# Patient Record
Sex: Female | Born: 1970 | Race: White | Hispanic: No | Marital: Married | Smoking: Never smoker
Health system: Southern US, Community
[De-identification: ages and names within clinical notes are randomized; demographics above are authoritative.]

## PROBLEM LIST (undated history)

## (undated) DIAGNOSIS — J45909 Unspecified asthma, uncomplicated: Secondary | ICD-10-CM

## (undated) DIAGNOSIS — T7840XA Allergy, unspecified, initial encounter: Secondary | ICD-10-CM

## (undated) DIAGNOSIS — Z8041 Family history of malignant neoplasm of ovary: Secondary | ICD-10-CM

## (undated) DIAGNOSIS — M069 Rheumatoid arthritis, unspecified: Secondary | ICD-10-CM

## (undated) DIAGNOSIS — Z1371 Encounter for nonprocreative screening for genetic disease carrier status: Secondary | ICD-10-CM

## (undated) DIAGNOSIS — N83299 Other ovarian cyst, unspecified side: Secondary | ICD-10-CM

## (undated) DIAGNOSIS — Z9189 Other specified personal risk factors, not elsewhere classified: Secondary | ICD-10-CM

## (undated) DIAGNOSIS — Z803 Family history of malignant neoplasm of breast: Secondary | ICD-10-CM

## (undated) DIAGNOSIS — E785 Hyperlipidemia, unspecified: Secondary | ICD-10-CM

## (undated) DIAGNOSIS — R51 Headache: Secondary | ICD-10-CM

## (undated) DIAGNOSIS — R519 Headache, unspecified: Secondary | ICD-10-CM

## (undated) HISTORY — DX: Other specified personal risk factors, not elsewhere classified: Z91.89

## (undated) HISTORY — DX: Encounter for nonprocreative screening for genetic disease carrier status: Z13.71

## (undated) HISTORY — DX: Hyperlipidemia, unspecified: E78.5

## (undated) HISTORY — DX: Allergy, unspecified, initial encounter: T78.40XA

## (undated) HISTORY — DX: Family history of malignant neoplasm of ovary: Z80.41

## (undated) HISTORY — DX: Unspecified asthma, uncomplicated: J45.909

## (undated) HISTORY — PX: INGUINAL HERNIA REPAIR: SUR1180

## (undated) HISTORY — DX: Rheumatoid arthritis, unspecified: M06.9

## (undated) HISTORY — DX: Headache, unspecified: R51.9

## (undated) HISTORY — PX: APPENDECTOMY: SHX54

## (undated) HISTORY — DX: Headache: R51

## (undated) HISTORY — DX: Family history of malignant neoplasm of breast: Z80.3

## (undated) HISTORY — PX: BIOPSY BREAST: PRO8

## (undated) HISTORY — DX: Other ovarian cyst, unspecified side: N83.299

---

## 2010-04-29 HISTORY — PX: BREAST EXCISIONAL BIOPSY: SUR124

## 2010-04-29 HISTORY — PX: BREAST BIOPSY: SHX20

## 2014-04-29 DIAGNOSIS — Z9189 Other specified personal risk factors, not elsewhere classified: Secondary | ICD-10-CM

## 2014-04-29 DIAGNOSIS — Z1371 Encounter for nonprocreative screening for genetic disease carrier status: Secondary | ICD-10-CM

## 2014-04-29 HISTORY — DX: Encounter for nonprocreative screening for genetic disease carrier status: Z13.71

## 2014-04-29 HISTORY — DX: Other specified personal risk factors, not elsewhere classified: Z91.89

## 2015-05-12 DIAGNOSIS — J301 Allergic rhinitis due to pollen: Secondary | ICD-10-CM | POA: Diagnosis not present

## 2015-05-15 DIAGNOSIS — J301 Allergic rhinitis due to pollen: Secondary | ICD-10-CM | POA: Diagnosis not present

## 2015-05-19 DIAGNOSIS — N946 Dysmenorrhea, unspecified: Secondary | ICD-10-CM | POA: Diagnosis not present

## 2015-05-19 DIAGNOSIS — Z315 Encounter for genetic counseling: Secondary | ICD-10-CM | POA: Diagnosis not present

## 2015-05-19 DIAGNOSIS — Z803 Family history of malignant neoplasm of breast: Secondary | ICD-10-CM | POA: Diagnosis not present

## 2015-05-19 DIAGNOSIS — Z8041 Family history of malignant neoplasm of ovary: Secondary | ICD-10-CM | POA: Diagnosis not present

## 2015-06-16 DIAGNOSIS — N83201 Unspecified ovarian cyst, right side: Secondary | ICD-10-CM | POA: Diagnosis not present

## 2015-06-16 DIAGNOSIS — N946 Dysmenorrhea, unspecified: Secondary | ICD-10-CM | POA: Diagnosis not present

## 2015-06-16 DIAGNOSIS — E782 Mixed hyperlipidemia: Secondary | ICD-10-CM | POA: Diagnosis not present

## 2015-06-23 ENCOUNTER — Encounter: Payer: Self-pay | Admitting: Cardiovascular Disease

## 2015-06-23 ENCOUNTER — Encounter (INDEPENDENT_AMBULATORY_CARE_PROVIDER_SITE_OTHER): Payer: Self-pay

## 2015-06-23 ENCOUNTER — Ambulatory Visit (INDEPENDENT_AMBULATORY_CARE_PROVIDER_SITE_OTHER): Payer: 59 | Admitting: Cardiovascular Disease

## 2015-06-23 VITALS — BP 120/82 | HR 78 | Ht 62.0 in | Wt 147.0 lb

## 2015-06-23 DIAGNOSIS — R0602 Shortness of breath: Secondary | ICD-10-CM

## 2015-06-23 DIAGNOSIS — I498 Other specified cardiac arrhythmias: Secondary | ICD-10-CM | POA: Diagnosis not present

## 2015-06-23 DIAGNOSIS — I499 Cardiac arrhythmia, unspecified: Secondary | ICD-10-CM | POA: Diagnosis not present

## 2015-06-23 DIAGNOSIS — Z0000 Encounter for general adult medical examination without abnormal findings: Secondary | ICD-10-CM

## 2015-06-23 DIAGNOSIS — E785 Hyperlipidemia, unspecified: Secondary | ICD-10-CM | POA: Insufficient documentation

## 2015-06-23 MED ORDER — ROSUVASTATIN CALCIUM 5 MG PO TABS: 5.0000 mg | ORAL_TABLET | Freq: Every day | ORAL | Status: DC

## 2015-06-23 MED ORDER — EZETIMIBE 10 MG PO TABS: 10.0000 mg | ORAL_TABLET | Freq: Every day | ORAL | Status: DC

## 2015-06-23 NOTE — Progress Notes (Signed)
Patient ID: Jamie Stanley, female    DOB: [birth year], 45 y.o.   MRN: [id number]  HPI Comments: Ms. Marcel Paris is a pleasant 45 year old woman with history of hyperlipidemia, grandfather with coronary disease, previous history of asthma, who presents for evaluation of "flip-flopping"in her chest, palpitations.   She presents by self-referral for evaluation  She reports that over the past year she has noticed rare episodes of her heart flipping. It will happen for a split second, she has very short episode of lightheadedness lasting for several seconds, less than a minute, then symptoms resolve. She has these possibly once per month. These can present at rest, most recently while driving felt like she almost needed to pull over.  Denies any shortness of breath or chest pressure on exertion. Works long hours, sometimes on weekends, 3 children ages 44 up into the 29s.  She reports that she and her husband would like to start an exercise program, once to make sure everything is okay given her above symptoms. She does have some shortness of breath if she climbs a flight of stairs quickly. Lab work reviewed with her showing LDL 150, total cholesterol 243, HDL 69  EKG shows normal sinus rhythm with rate 77 bpm, no significant ST or T-wave changes  She reports she previously tried Lipitor and simvastatin, she developed arthralgias in her hands and stopped the medications   Allergies  Allergen Reactions  . Demerol [Meperidine]     Nausea    . Penicillins     Rash     No current outpatient prescriptions on file prior to visit.   No current facility-administered medications on file prior to visit.    Past Medical History  Diagnosis Date  . Hyperlipidemia     Past Surgical History  Procedure Laterality Date  . Appendectomy    . Inguinal hernia repair    . Biopsy breast      left     Social History  reports that she has never smoked. She does not have any smokeless tobacco history on  file. She reports that she does not drink alcohol or use illicit drugs.  Family History family history includes Heart disease (age of onset: 105) in her paternal grandfather; Hyperlipidemia in her father and mother; Hypertension in her father and mother.   Review of Systems  Constitutional: Negative.   HENT: Negative.   Eyes: Negative.   Respiratory: Negative.   Cardiovascular: Positive for palpitations.  Gastrointestinal: Negative.   Endocrine: Negative.   Musculoskeletal: Negative.   Skin: Negative.   Allergic/Immunologic: Negative.   Neurological: Negative.   Hematological: Negative.   Psychiatric/Behavioral: Negative.   All other systems reviewed and are negative.   BP 120/82 mmHg  Pulse 78  Ht 5\' 2"  (1.575 m)  Wt 147 lb (66.679 kg)  BMI 26.88 kg/m2  Physical Exam  Constitutional: She is oriented to person, place, and time. She appears well-developed and well-nourished.  HENT:  Head: Normocephalic.  Nose: Nose normal.  Mouth/Throat: Oropharynx is clear and moist.  Eyes: Conjunctivae are normal. Pupils are equal, round, and reactive to light.  Neck: Normal range of motion. Neck supple. No JVD present.  Cardiovascular: Normal rate, regular rhythm, normal heart sounds and intact distal pulses.  Exam reveals no gallop and no friction rub.   No murmur heard. Pulmonary/Chest: Effort normal and breath sounds normal. No respiratory distress. She has no wheezes. She has no rales. She exhibits no tenderness.  Abdominal: Soft. Bowel sounds are normal. She  exhibits no distension. There is no tenderness.  Musculoskeletal: Normal range of motion. She exhibits no edema or tenderness.  Lymphadenopathy:    She has no cervical adenopathy.  Neurological: She is alert and oriented to person, place, and time. Coordination normal.  Skin: Skin is warm and dry. No rash noted. No erythema.  Psychiatric: She has a normal mood and affect. Her behavior is normal. Judgment and thought content  normal.

## 2015-06-23 NOTE — Assessment & Plan Note (Signed)
Long discussion concerning various types of arrhythmia I suspect she may be having rare episodes of PVCs These are not frequent enough to warrant daily medication or even medications on an as-needed basis. We have suggested if she starts to have more episodes, we could order a 30 day monitor to identify her ectopy. Otherwise clinical exam essentially benign, normal EKG indicating no significant structural heart disease.  Recommended it would be okay to start a regular exercise program

## 2015-06-23 NOTE — Assessment & Plan Note (Signed)
Long discussion concerning screening tests such as carotid ultrasounds and CT coronary calcium scoring. She will call us if she would like any of these scheduled

## 2015-06-23 NOTE — Patient Instructions (Addendum)
You are doing well.  Please try crestor two days a week  When zetia goes generic, Start one a day   For husband: goal ldl <70, total chol <150  Call the office if the palpitations get worse, 30 day monitor could be ordered   Research CT coronary calcium score  $150 [place]  Also available is the carotid ultrasound , $50  Please call us if you have new issues that need to be addressed before your next appt.

## 2015-06-23 NOTE — Assessment & Plan Note (Addendum)
Elevated cholesterol, difficulty tolerating Lipitor and simvastatin in the past Recommended she try Crestor 5 mg 2 days per week, would also add zetia 10 mg daily when this goes generic in March/April  would be okay to stay on fenofibrate if she likes, though typically this is more beneficial for triglycerides  She does have a strong family history of coronary artery disease and we have recommended a aggressive course of action . Recommended a exercise program, weight loss

## 2015-07-31 DIAGNOSIS — J301 Allergic rhinitis due to pollen: Secondary | ICD-10-CM | POA: Diagnosis not present

## 2015-08-18 DIAGNOSIS — J301 Allergic rhinitis due to pollen: Secondary | ICD-10-CM | POA: Diagnosis not present

## 2015-08-21 DIAGNOSIS — J301 Allergic rhinitis due to pollen: Secondary | ICD-10-CM | POA: Diagnosis not present

## 2015-11-24 DIAGNOSIS — J301 Allergic rhinitis due to pollen: Secondary | ICD-10-CM | POA: Diagnosis not present

## 2015-11-30 DIAGNOSIS — J301 Allergic rhinitis due to pollen: Secondary | ICD-10-CM | POA: Diagnosis not present

## 2015-12-19 DIAGNOSIS — M5387 Other specified dorsopathies, lumbosacral region: Secondary | ICD-10-CM | POA: Diagnosis not present

## 2015-12-19 DIAGNOSIS — M531 Cervicobrachial syndrome: Secondary | ICD-10-CM | POA: Diagnosis not present

## 2015-12-19 DIAGNOSIS — M9903 Segmental and somatic dysfunction of lumbar region: Secondary | ICD-10-CM | POA: Diagnosis not present

## 2015-12-26 DIAGNOSIS — M531 Cervicobrachial syndrome: Secondary | ICD-10-CM | POA: Diagnosis not present

## 2015-12-26 DIAGNOSIS — M9903 Segmental and somatic dysfunction of lumbar region: Secondary | ICD-10-CM | POA: Diagnosis not present

## 2015-12-26 DIAGNOSIS — M5387 Other specified dorsopathies, lumbosacral region: Secondary | ICD-10-CM | POA: Diagnosis not present

## 2016-02-27 ENCOUNTER — Ambulatory Visit: Payer: Self-pay | Admitting: Physician Assistant

## 2016-02-27 ENCOUNTER — Encounter: Payer: Self-pay | Admitting: Physician Assistant

## 2016-02-27 VITALS — BP 130/80 | HR 80 | Temp 98.4°F

## 2016-02-27 DIAGNOSIS — M5416 Radiculopathy, lumbar region: Secondary | ICD-10-CM

## 2016-02-27 MED ORDER — PREDNISONE 10 MG (48) PO TBPK: ORAL_TABLET | Freq: Every day | ORAL | 0 refills | Status: DC

## 2016-02-27 MED ORDER — BACLOFEN 10 MG PO TABS: 10.0000 mg | ORAL_TABLET | Freq: Three times a day (TID) | ORAL | 0 refills | Status: DC

## 2016-02-27 NOTE — Progress Notes (Signed)
S:  C/o low back pain for 3 months, no known injury, pain is worse with movement, increased with bending over, + numbness, tingling, into both legs, but it will change daily; no changes in bowel/urinary habits,  Using otc meds without relief, mother has had multiple disc problems Remainder ros neg  O:  Vitals wnl, nad, lungs c t a, cv rrr, spine tender in lumbar area, muscles in lower back spasmed , decreased rom with bending forward,  Neg slr, pt walks without difficulty, able to walk on toes and heels;  no foot drop noted, n/v intact  A: acute back pain,   P: use wet heat followed by ice, stretches, return to clinic if not better in 3 t 5 days, return earlier if worsening, rx meds: sterapred ds 10mg  12 d dose pack, baclofen, will refer to [facility] neurology as that is where her mother goes, will order mri if not better in 1 week

## 2016-03-01 DIAGNOSIS — J301 Allergic rhinitis due to pollen: Secondary | ICD-10-CM | POA: Diagnosis not present

## 2016-03-16 DIAGNOSIS — H52223 Regular astigmatism, bilateral: Secondary | ICD-10-CM | POA: Diagnosis not present

## 2016-03-18 DIAGNOSIS — J301 Allergic rhinitis due to pollen: Secondary | ICD-10-CM | POA: Diagnosis not present

## 2016-04-09 DIAGNOSIS — Z01419 Encounter for gynecological examination (general) (routine) without abnormal findings: Secondary | ICD-10-CM | POA: Diagnosis not present

## 2016-04-09 DIAGNOSIS — Z0000 Encounter for general adult medical examination without abnormal findings: Secondary | ICD-10-CM | POA: Diagnosis not present

## 2016-04-09 DIAGNOSIS — E785 Hyperlipidemia, unspecified: Secondary | ICD-10-CM | POA: Diagnosis not present

## 2016-04-09 DIAGNOSIS — Z1321 Encounter for screening for nutritional disorder: Secondary | ICD-10-CM | POA: Diagnosis not present

## 2016-04-09 DIAGNOSIS — Z3041 Encounter for surveillance of contraceptive pills: Secondary | ICD-10-CM | POA: Diagnosis not present

## 2016-04-09 DIAGNOSIS — Z1329 Encounter for screening for other suspected endocrine disorder: Secondary | ICD-10-CM | POA: Diagnosis not present

## 2016-04-09 DIAGNOSIS — Z1239 Encounter for other screening for malignant neoplasm of breast: Secondary | ICD-10-CM | POA: Diagnosis not present

## 2016-04-10 ENCOUNTER — Other Ambulatory Visit: Payer: Self-pay | Admitting: Obstetrics and Gynecology

## 2016-04-10 DIAGNOSIS — Z1231 Encounter for screening mammogram for malignant neoplasm of breast: Secondary | ICD-10-CM

## 2017-01-21 ENCOUNTER — Encounter: Payer: Self-pay | Admitting: Nurse Practitioner

## 2017-01-21 ENCOUNTER — Ambulatory Visit (INDEPENDENT_AMBULATORY_CARE_PROVIDER_SITE_OTHER): Payer: BC Managed Care – PPO | Admitting: Nurse Practitioner

## 2017-01-21 VITALS — BP 122/72 | HR 77 | Temp 98.7°F | Ht 62.0 in | Wt 137.8 lb

## 2017-01-21 DIAGNOSIS — Z23 Encounter for immunization: Secondary | ICD-10-CM

## 2017-01-21 DIAGNOSIS — Z7689 Persons encountering health services in other specified circumstances: Secondary | ICD-10-CM

## 2017-01-21 DIAGNOSIS — L659 Nonscarring hair loss, unspecified: Secondary | ICD-10-CM

## 2017-01-21 DIAGNOSIS — R002 Palpitations: Secondary | ICD-10-CM | POA: Diagnosis not present

## 2017-01-21 DIAGNOSIS — E7800 Pure hypercholesterolemia, unspecified: Secondary | ICD-10-CM | POA: Diagnosis not present

## 2017-01-21 DIAGNOSIS — Z91018 Allergy to other foods: Secondary | ICD-10-CM | POA: Diagnosis not present

## 2017-01-21 MED ORDER — EPINEPHRINE 0.3 MG/0.3ML IJ SOAJ: 0.3000 mg | Freq: Once | INTRAMUSCULAR | 1 refills | Status: AC

## 2017-01-21 NOTE — Patient Instructions (Addendum)
Jamie Stanley, Thank you for coming in to clinic today.  1. Thyroid: - Labs again today will check for function and presence of possible Hashimoto's Thyroiditis.  2. Cholesterol: - Consider injectable medications Repatha or Praluent.  Injection Subcutaneously every 2 weeks.  You will be due for FASTING BLOOD WORK (no food or drink after midnight before, only water or coffee without cream/sugar on the morning of) - Please go ahead and schedule a "Lab Only" visit in the morning at the clinic for lab draw in the next 7 days between 8a -11:45a.  For Lab Results, once available within 2-3 days of blood draw, you can can log in to MyChart online to view your results and a brief explanation. Also, we can discuss results at next follow-up visit.  Please schedule a follow-up appointment with [name], AGNP. Return in about 6 months (around 07/21/2017) for cholesterol.  If you have any other questions or concerns, please feel free to call the clinic or send a message through [place]. You may also schedule an earlier appointment if necessary.  You will receive a survey after today's visit either digitally by e-mail or paper by [employer]. Your experiences and feedback matter to [place].  Please respond so we know how we are doing as we provide care for you.   [name], DNP, AGNP-BC Adult Gerontology Nurse Practitioner [facility], [facility]    Alirocumab (Praluent)  Evolocumab (Repatha) injection What is this medicine? EVOLOCUMAB (e voe LOK ue mab) is known as a PCSK9 inhibitor. It is used to lower the level of cholesterol in the blood. It may be used alone or in combination with other cholesterol-lowering drugs. This drug may also be used to reduce the risk of heart attack, stroke, and certain types of heart surgery in patients with heart disease. This medicine may be used for other purposes; ask your health care provider or pharmacist if you have questions. COMMON BRAND NAME(S):  REPATHAAlirocumab injection What is this medicine? ALIROCUMAB (al i ROC ue mab) is known as a PCSK9 inhibitor. It is used to lower the level of cholesterol in the blood. This medicine is only for patients whose cholesterol is not controlled by diet and statin therapy. This medicine may be used for other purposes; ask your health care provider or pharmacist if you have questions. COMMON BRAND NAME(S): Praluent What should I tell my health care provider before I take this medicine? They need to know if you have any of these conditions: -any unusual or allergic reaction to alirocumab, other medicines, foods, dyes, or preservatives -pregnant or trying to get pregnant -breast-feeding How should I use this medicine? This medicine is for injection under the skin. You will be taught how to prepare and give this medicine. Use exactly as directed. Take your medicine at regular intervals. Do not take your medicine more often than directed. It is important that you put your used needles and syringes in a special sharps container. Do not put them in a trash can. If you do not have a sharps container, call your pharmacist or healthcare provider to get one. Talk to your pediatrician regarding the use of this medicine in children. Special care may be needed. Overdosage: If you think you have taken too much of this medicine contact a poison control center or emergency room at once. NOTE: This medicine is only for you. Do not share this medicine with others. What if I miss a dose? If you are on an every 2 week  schedule and miss a dose, take it as soon as you can. If your next dose is to be taken in less than 7 days, then do not take the missed dose. Take the next dose at your regular time. Do not take double or extra doses. If you are on a monthly schedule and miss a dose, take it as soon as you can. If the dose given is within 7 days of the missed dose, continue with your regular monthly schedule. If the missed dose  is administered after 7 days of the original date, administer the dose and start a new monthly schedule based on this date. What may interact with this medicine? Interactions are not expected. This list may not describe all possible interactions. Give your health care provider a list of all the medicines, herbs, non-prescription drugs, or dietary supplements you use. Also tell them if you smoke, drink alcohol, or use illegal drugs. Some items may interact with your medicine. What should I watch for while using this medicine? You may need blood work done while you are taking this medicine. What side effects may I notice from receiving this medicine? Side effects that you should report to your doctor or health care professional as soon as possible: -allergic reactions like skin rash, itching or hives, swelling of the face, lips, or tongue -signs and symptoms of infection like fever or chills; cough; sore throat; pain or trouble passing urine -signs and symptoms of liver injury like dark yellow or brown urine; general ill feeling or flu-like symptoms; light-colored stools; loss of appetite; nausea; right upper belly pain; unusually weak or tired; yellowing of the eyes or skin Side effects that usually do not require medical attention (report to your doctor or health care professional if they continue or are bothersome): -diarrhea -muscle cramps -muscle pain -pain, redness, or irritation at site where injected This list may not describe all possible side effects. Call your doctor for medical advice about side effects. You may report side effects to FDA at 1-800-FDA-1088. Where should I keep my medicine? Keep out of the reach of children. You will be instructed on how to store this medicine. Throw away any unused medicine after the expiration date on the label. NOTE: This sheet is a summary. It may not cover all possible information. If you have questions about this medicine, talk to your doctor,  pharmacist, or health care provider.  2018 Elsevier/Gold Standard (2015-08-23 15:09:06)  What should I tell my health care provider before I take this medicine? They need to know if you have any of these conditions: -an unusual or allergic reaction to evolocumab, other medicines, foods, dyes, or preservatives -pregnant or trying to get pregnant -breast-feeding How should I use this medicine? This medicine is for injection under the skin. You will be taught how to prepare and give this medicine. Use exactly as directed. Take your medicine at regular intervals. Do not take your medicine more often than directed. It is important that you put your used needles and syringes in a special sharps container. Do not put them in a trash can. If you do not have a sharps container, call your pharmacist or health care provider to get one. Talk to your pediatrician regarding the use of this medicine in children. While this drug may be prescribed for children as young as 13 years for selected conditions, precautions do apply. Overdosage: If you think you have taken too much of this medicine contact a poison control center or emergency room  at once. NOTE: This medicine is only for you. Do not share this medicine with others. What if I miss a dose? If you miss a dose, take it as soon as you can if there are more than 7 days until the next scheduled dose, or skip the missed dose and take the next dose according to your original schedule. Do not take double or extra doses. What may interact with this medicine? Interactions are not expected. This list may not describe all possible interactions. Give your health care provider a list of all the medicines, herbs, non-prescription drugs, or dietary supplements you use. Also tell them if you smoke, drink alcohol, or use illegal drugs. Some items may interact with your medicine. What should I watch for while using this medicine? You may need blood work while you are  taking this medicine. What side effects may I notice from receiving this medicine? Side effects that you should report to your doctor or health care professional as soon as possible: -allergic reactions like skin rash, itching or hives, swelling of the face, lips, or tongue -signs and symptoms of infection like fever or chills; cough; sore throat; pain or trouble passing urine Side effects that usually do not require medical attention (report to your doctor or health care professional if they continue or are bothersome): -diarrhea -nausea -muscle pain -pain, redness, or irritation at site where injected This list may not describe all possible side effects. Call your doctor for medical advice about side effects. You may report side effects to FDA at 1-800-FDA-1088. Where should I keep my medicine? Keep out of the reach of children. You will be instructed on how to store this medicine. Throw away any unused medicine after the expiration date on the label. NOTE: This sheet is a summary. It may not cover all possible information. If you have questions about this medicine, talk to your doctor, pharmacist, or health care provider.  2018 Elsevier/Gold Standard (2016-04-01 13:21:53)

## 2017-01-21 NOTE — Progress Notes (Signed)
Subjective:    Patient ID: Jamie Stanley, female    DOB: 1970/08/17, 46 y.o.   MRN: [id number]  Loma [place] is a 46 y.o. female presenting on 01/21/2017 for [place] Provider Pt last seen by PCP many years ago, but has had regular care by Fraser Din at [facility] .  Obtain records.  Hypercholesterolemia - has trouble w/ joints and statins - Is now taking red yeast rice since December.  Eating better and has lost 15 lbs. - Has tried Zetia, Crestor, Lipitor. - Most recently has had NMR lipoprofile checked 03/2016 and reveals elevated LDL w/ small dense LDL particles and high particle LDL number.  Family history hypothyroidism in females (mother, Mat aunt, MGM):  Most recent TSH via [facility] 12/2017was normal at 2.270.  Pt has lab record with her today. Pt has concerns about her thyroid function.  Notes she has had hair loss, feeling cold, palpitations.   - She denies, fatigue, excess energy, weight changes, heart racing, heat intolerance, changes in skin/nails, and lower leg swelling.  - She does not have any compressive symptoms to include difficulty swallowing, globus sensation, or difficulty breathing when lying flat.  Past Medical History:  Diagnosis Date  . Allergy   . Frequent headaches   . Hyperlipidemia   . Physiological ovarian cysts    Past Surgical History:  Procedure Laterality Date  . APPENDECTOMY    . BIOPSY BREAST     left   . INGUINAL HERNIA REPAIR     Social History   Social History  . Marital status: Married    Spouse name: N/A  . Number of children: N/A  . Years of education: N/A   Occupational History  . Not on file.   Social History Main Topics  . Smoking status: Never Smoker  . Smokeless tobacco: Never Used  . Alcohol use No  . Drug use: No  . Sexual activity: Not on file   Other Topics Concern  . Not on file   Social History Narrative  . No narrative on file   Family History  Problem Relation Age of  Onset  . Hypertension Mother   . Hyperlipidemia Mother   . Breast cancer Mother   . AAA (abdominal aortic aneurysm) Mother   . Hypothyroidism Mother   . Hypertension Father   . Hyperlipidemia Father   . Heart disease Paternal Grandfather 48  . Ovarian cancer Maternal Grandmother   . Hypothyroidism Maternal Grandmother   . Hypothyroidism Sister   . Hypothyroidism Paternal Grandmother    Current Outpatient Prescriptions on File Prior to Visit  Medication Sig  . acetaminophen (TYLENOL) 325 MG tablet Take 650 mg by mouth every 6 (six) hours as needed.  Marland Kitchen ibuprofen (ADVIL,MOTRIN) 800 MG tablet Take 800 mg by mouth every 8 (eight) hours as needed.  . loratadine (CLARITIN) 10 MG tablet Take 10 mg by mouth daily.  Marland Kitchen ezetimibe (ZETIA) 10 MG tablet Take 1 tablet (10 mg total) by mouth daily. (Patient not taking: Reported on 01/21/2017)  . NON FORMULARY Allergy shots   No current facility-administered medications on file prior to visit.     Review of Systems  Constitutional: Negative.   HENT: Negative.   Eyes: Negative.   Respiratory: Negative.   Cardiovascular: Positive for palpitations.  Gastrointestinal: Negative.   Endocrine: Positive for cold intolerance.       Hair loss  Genitourinary: Negative.   Musculoskeletal: Negative.   Skin: Negative.  Allergic/Immunologic: Negative.   Neurological: Negative.   Hematological: Negative.   Psychiatric/Behavioral: Negative.    Per HPI unless specifically indicated above      Objective:    BP 122/72 (BP Location: Right Arm, Patient Position: Sitting, Cuff Size: Normal)   Pulse 77   Temp 98.7 F (37.1 C) (Oral)   Ht 5\' 2"  (1.575 m)   Wt 137 lb 12.8 oz (62.5 kg)   BMI 25.20 kg/m   Wt Readings from Last 3 Encounters:  01/21/17 137 lb 12.8 oz (62.5 kg)  06/23/15 147 lb (66.7 kg)    Physical Exam General - healthy, well-appearing, NAD HEENT - Normocephalic, atraumatic Neck - supple, non-tender, no LAD, no thyromegaly, no  carotid bruit Heart - RRR, no murmurs heard Lungs - Clear throughout all lobes, no wheezing, crackles, or rhonchi. Normal work of breathing. Abdomen - soft, NTND, no masses, no hepatosplenomegaly, active bowel sounds.  Mild LLQ tenderness. Extremeties - non-tender, no edema, cap refill < 2 seconds, peripheral pulses intact +2 bilaterally Skin - warm, dry Neuro - awake, alert, oriented x3, normal gait Psych - Normal mood and affect, normal behavior   No results found for this or any previous visit.    Assessment & Plan:   Problem List Items Addressed This Visit      Other   Hyperlipidemia    Pt has persistent hyperlipidemia.  Has had NMR lipoprofile collected in 03/2016 which indicates higher risk for ASCVD events.  Pt has had statin intolerance in past, intolerance to Zetia also.  No longer taking fibrate or any other lipid management medication.  Pt is taking red yeast rice as natural remedy.   Discussed lack of evidence available for ASCVD risk reduction.  Plan: 1. Reviewed options for anti PCSK-9 injectable medications.  Pt declines for now.  Provided information and highly recommended to patient to reduce ASCVD risk w/ strong family hx of CAD. 2. Reviewed low glycemic diet, physical activity. Recommend weight loss 5 lbs. 3. Recheck standard lipid panel off medications and on red yeast rice. 4. Follow up 3-6 months.      Relevant Orders   Lipid panel    Other Visit Diagnoses    Need for diphtheria-tetanus-pertussis (Tdap) vaccine    -  Primary Pt needs tetanus vaccine.  > 10 years since last vaccination.  Plan: 1. Reviewed tetanus disease and need for vaccination. 2. Administer vaccine today.   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  Needs refill of epipen since her current pen is expired.  Plan: 1. Epinephrine auto-injector 0.3 mg provided. 2. Follow up as needed    Intermittent palpitations       Hair loss     Pt w/ family history of hypothyroidism.  Pt has not been tested for autoimmune causes for hypothyroidism in past.  Has had TSH last in December 2017.  Plan: 1. Labs today to recheck TSH, Free T3, Free T4.   2. TPO ab today to screen for autoimmune thyroid disease. 3. Follow up 1x per year and after labs.   Relevant Orders   TSH   T3, free   T4, free   Thyroid peroxidase antibody   Encounter to establish care     Previous care received at [facility].  Records will be requested.  Past medical, family, and surgical history reviewed w/ pt.  Meds ordered this encounter  Medications  . MICROGESTIN FE 1/20 1-20 MG-MCG tablet  . Red Yeast Rice Extract 300 MG CAPS    Sig: Take by mouth.  . DISCONTD: EPINEPHrine (EPIPEN 2-PAK) 0.3 mg/0.3 mL IJ SOAJ injection    Sig: Inject into the muscle once.  Marland Kitchen EPINEPHrine (EPIPEN 2-PAK) 0.3 mg/0.3 mL IJ SOAJ injection    Sig: Inject 0.3 mLs (0.3 mg total) into the muscle once.    Dispense:  2 Device    Refill:  1     Follow up plan: Return in about 6 months (around 07/21/2017) for cholesterol.  [name], DNP, AGPCNP-BC Adult Gerontology Primary Care Nurse Practitioner [facility] 01/24/2017, 1:14 PM

## 2017-01-24 ENCOUNTER — Encounter: Payer: Self-pay | Admitting: Nurse Practitioner

## 2017-01-24 DIAGNOSIS — Z91018 Allergy to other foods: Secondary | ICD-10-CM | POA: Insufficient documentation

## 2017-01-24 NOTE — Assessment & Plan Note (Addendum)
Pt has persistent hyperlipidemia.  Has had NMR lipoprofile collected in 03/2016 which indicates higher risk for ASCVD events.  Pt has had statin intolerance in past, intolerance to Zetia also.  No longer taking fibrate or any other lipid management medication.  Plan: 1. Reviewed options for anti PCSK-9 injectable medications.  Pt declines for now.  Provided information and highly recommended to patient to reduce ASCVD risk w/ strong family hx of CAD. 2. Reviewed low glycemic diet, physical activity. Recommend weight loss 5 lbs. 3. Follow up 3-6 months.

## 2017-01-28 ENCOUNTER — Other Ambulatory Visit: Payer: BC Managed Care – PPO

## 2017-01-29 LAB — LIPID PANEL
Cholesterol: 193 mg/dL (ref <200)
HDL: 52 mg/dL (ref >50)
LDL Cholesterol (Calc): 122 mg/dL (calc) — ABNORMAL HIGH
Non-HDL Cholesterol (Calc): 141 mg/dL (calc) — ABNORMAL HIGH (ref <130)
Total CHOL/HDL Ratio: 3.7 (calc) (ref <5.0)
Triglycerides: 89 mg/dL (ref <150)

## 2017-01-29 LAB — TSH: TSH: 1.4 mIU/L

## 2017-01-29 LAB — T4, FREE: Free T4: 1.1 ng/dL (ref 0.8–1.8)

## 2017-01-29 LAB — T3, FREE: T3, Free: 3.1 pg/mL (ref 2.3–4.2)

## 2017-01-29 LAB — THYROID PEROXIDASE ANTIBODY: Thyroperoxidase Ab SerPl-aCnc: 1 IU/mL (ref <9)

## 2017-01-30 ENCOUNTER — Encounter: Payer: Self-pay | Admitting: Nurse Practitioner

## 2017-02-21 ENCOUNTER — Ambulatory Visit (INDEPENDENT_AMBULATORY_CARE_PROVIDER_SITE_OTHER): Payer: BC Managed Care – PPO

## 2017-02-21 DIAGNOSIS — Z23 Encounter for immunization: Secondary | ICD-10-CM

## 2017-03-11 ENCOUNTER — Encounter: Payer: Self-pay | Admitting: Obstetrics and Gynecology

## 2017-03-11 ENCOUNTER — Other Ambulatory Visit: Payer: Self-pay

## 2017-03-11 MED ORDER — MICROGESTIN FE 1/20 1-20 MG-MCG PO TABS: 1.0000 | ORAL_TABLET | Freq: Every day | ORAL | 0 refills | Status: DC

## 2017-04-04 ENCOUNTER — Other Ambulatory Visit: Payer: Self-pay

## 2017-04-04 ENCOUNTER — Telehealth: Payer: Self-pay

## 2017-04-04 MED ORDER — MICROGESTIN FE 1/20 1-20 MG-MCG PO TABS: 1.0000 | ORAL_TABLET | Freq: Every day | ORAL | 1 refills | Status: DC

## 2017-04-04 NOTE — Telephone Encounter (Signed)
Pt had to r/s her AE. Reqeusting refills to last til 05/14/17 AE. Pharmacy [facility]. Cb#[phone]

## 2017-04-08 ENCOUNTER — Ambulatory Visit: Payer: Self-pay | Admitting: Obstetrics and Gynecology

## 2017-05-14 ENCOUNTER — Ambulatory Visit (INDEPENDENT_AMBULATORY_CARE_PROVIDER_SITE_OTHER): Payer: BC Managed Care – PPO | Admitting: Obstetrics and Gynecology

## 2017-05-14 ENCOUNTER — Encounter: Payer: Self-pay | Admitting: Obstetrics and Gynecology

## 2017-05-14 VITALS — BP 110/66 | HR 64 | Ht 62.0 in | Wt 140.0 lb

## 2017-05-14 DIAGNOSIS — Z124 Encounter for screening for malignant neoplasm of cervix: Secondary | ICD-10-CM

## 2017-05-14 DIAGNOSIS — Z01419 Encounter for gynecological examination (general) (routine) without abnormal findings: Secondary | ICD-10-CM

## 2017-05-14 DIAGNOSIS — Z3041 Encounter for surveillance of contraceptive pills: Secondary | ICD-10-CM

## 2017-05-14 DIAGNOSIS — Z1151 Encounter for screening for human papillomavirus (HPV): Secondary | ICD-10-CM | POA: Diagnosis not present

## 2017-05-14 DIAGNOSIS — Z1239 Encounter for other screening for malignant neoplasm of breast: Secondary | ICD-10-CM

## 2017-05-14 DIAGNOSIS — Z9189 Other specified personal risk factors, not elsewhere classified: Secondary | ICD-10-CM | POA: Insufficient documentation

## 2017-05-14 DIAGNOSIS — Z803 Family history of malignant neoplasm of breast: Secondary | ICD-10-CM

## 2017-05-14 DIAGNOSIS — Z1231 Encounter for screening mammogram for malignant neoplasm of breast: Secondary | ICD-10-CM | POA: Diagnosis not present

## 2017-05-14 MED ORDER — MICROGESTIN FE 1/20 1-20 MG-MCG PO TABS: 1.0000 | ORAL_TABLET | Freq: Every day | ORAL | 3 refills | Status: DC

## 2017-05-14 NOTE — Progress Notes (Signed)
PCP:  Mikey College, NP   Chief Complaint  Patient presents with  . Gynecologic Exam     HPI:      [name] is a 47 y.o. [phone] who LMP was Patient's last menstrual period was 05/10/2017 (exact date)., presents today for her annual examination.  Her menses are absent to 1 day light spotting with OCPs. Hx of menorrhagia without OCPs.  Dysmenorrhea none. She does not have intermenstrual bleeding.  Sex activity: single partner, contraception - OCP (estrogen/progesterone).  Last Pap: March 29, 2014  Results were: no abnormalities /neg HPV DNA  Hx of STDs: none  Last mammogram: April 07, 2015  Results were: normal--routine follow-up in 12 months There is a FH of breast cancer in her mom, mat grt aunt and pat grt aunt.  There is a FH of ovarian cancer in her MGM. Pt is My Risk neg 2016, her mom is BRCA neg 2015. IBIS=33%. The patient does do self-breast exams. Is taking Vit D supp. Has not had breast MRI.  Tobacco use: The patient denies current or previous tobacco use. Alcohol use: none No drug use.  Exercise: not active  She does get adequate calcium and Vitamin D in her diet.  Labs/hyperlipidemia with PCP now.   Past Medical History:  Diagnosis Date  . Allergy   . Asthma   . BRCA negative 2016   MyRisk neg  . Family history of breast cancer   . Family history of ovarian cancer   . Frequent headaches   . Hyperlipidemia   . Increased risk of breast cancer 2016   IBIS=33%  . Physiological ovarian cysts     Past Surgical History:  Procedure Laterality Date  . APPENDECTOMY    . BIOPSY BREAST     left   . INGUINAL HERNIA REPAIR      Family History  Problem Relation Age of Onset  . Hypertension Mother   . Hyperlipidemia Mother   . Breast cancer Mother 49       ER/PR type BRCA Neg  . AAA (abdominal aortic aneurysm) Mother   . Hypothyroidism Mother   . Hypertension Father   . Hyperlipidemia Father   . Glaucoma Father   . Heart disease Paternal  Grandfather 61  . Diabetes Paternal Grandfather   . Ovarian cancer Maternal Grandmother 2  . Hypothyroidism Maternal Grandmother   . Hypothyroidism Sister   . Hypothyroidism Paternal Grandmother   . Diabetes Paternal Grandmother   . Breast cancer Other 68  . Breast cancer Other 88    Social History   Socioeconomic History  . Marital status: Married    Spouse name: Not on file  . Number of children: Not on file  . Years of education: Not on file  . Highest education level: Not on file  Social Needs  . Financial resource strain: Not on file  . Food insecurity - worry: Not on file  . Food insecurity - inability: Not on file  . Transportation needs - medical: Not on file  . Transportation needs - non-medical: Not on file  Occupational History  . Not on file  Tobacco Use  . Smoking status: Never Smoker  . Smokeless tobacco: Never Used  Substance and Sexual Activity  . Alcohol use: No  . Drug use: No  . Sexual activity: Yes    Birth control/protection: Pill  Other Topics Concern  . Not on file  Social History Narrative  . Not on file  No outpatient medications have been marked as taking for the 05/14/17 encounter (Office Visit) with Roseanna Koplin, Deirdre Evener, PA-C.     ROS:  Review of Systems  Constitutional: Negative for fatigue, fever and unexpected weight change.  Respiratory: Negative for cough, shortness of breath and wheezing.   Cardiovascular: Negative for chest pain, palpitations and leg swelling.  Gastrointestinal: Negative for blood in stool, constipation, diarrhea, nausea and vomiting.  Endocrine: Negative for cold intolerance, heat intolerance and polyuria.  Genitourinary: Negative for dyspareunia, dysuria, flank pain, frequency, genital sores, hematuria, menstrual problem, pelvic pain, urgency, vaginal bleeding, vaginal discharge and vaginal pain.  Musculoskeletal: Negative for back pain, joint swelling and myalgias.  Skin: Negative for rash.  Neurological:  Negative for dizziness, syncope, light-headedness, numbness and headaches.  Hematological: Negative for adenopathy.  Psychiatric/Behavioral: Negative for agitation, confusion, sleep disturbance and suicidal ideas. The patient is not nervous/anxious.      Objective: BP 110/66 (BP Location: Left Arm, Patient Position: Sitting, Cuff Size: Normal)   Pulse 64   Ht 5' 2"  (1.575 m)   Wt 140 lb (63.5 kg)   LMP 05/10/2017 (Exact Date)   BMI 25.61 kg/m    Physical Exam  Constitutional: She is oriented to person, place, and time. She appears well-developed and well-nourished.  Genitourinary: Vagina normal and uterus normal. There is no rash or tenderness on the right labia. There is no rash or tenderness on the left labia. No erythema or tenderness in the vagina. No vaginal discharge found. Right adnexum does not display mass and does not display tenderness. Left adnexum does not display mass and does not display tenderness. Cervix does not exhibit motion tenderness or polyp. Uterus is not enlarged or tender.  Neck: Normal range of motion. No thyromegaly present.  Cardiovascular: Normal rate, regular rhythm and normal heart sounds.  No murmur heard. Pulmonary/Chest: Effort normal and breath sounds normal. Right breast exhibits no mass, no nipple discharge, no skin change and no tenderness. Left breast exhibits no mass, no nipple discharge, no skin change and no tenderness.  Abdominal: Soft. There is no tenderness. There is no guarding.  Musculoskeletal: Normal range of motion.  Neurological: She is alert and oriented to person, place, and time. No cranial nerve deficit.  Psychiatric: She has a normal mood and affect. Her behavior is normal.  Vitals reviewed.   Assessment/Plan: Encounter for annual routine gynecological examination  Cervical cancer screening - Plan: IGP, Aptima HPV  Screening for HPV (human papillomavirus) - Plan: IGP, Aptima HPV  Encounter for surveillance of contraceptive  pills - OCP RF. - Plan: MICROGESTIN FE 1/20 1-20 MG-MCG tablet  Screening for breast cancer - Pt to sched 3D mammo. - Plan: MM SCREENING BREAST TOMO BILATERAL  Family history of breast cancer - Plan: MM SCREENING BREAST TOMO BILATERAL  Increased risk of breast cancer - IBIS=33%. Recommended QMO SBE, yearly CBE and mammos. Offered scr br MRI, pt to call for ref if desires. Cont Vit D supp. - Plan: MM SCREENING BREAST TOMO BILATERAL  Meds ordered this encounter  Medications  . MICROGESTIN FE 1/20 1-20 MG-MCG tablet    Sig: Take 1 tablet by mouth daily.    Dispense:  3 Package    Refill:  3             GYN counsel breast self exam, mammography screening, adequate intake of calcium and vitamin D, diet and exercise     F/U  Return in about 1 year (around 05/14/2018).  Elisheva Fallas B. Edgard Debord,  PA-C 05/14/2017 9:08 AM

## 2017-05-14 NOTE — Patient Instructions (Signed)
I value your feedback and entrusting us with your care. If you get a [facility] patient survey, I would appreciate you taking the time to let us know about your experience today. Thank you! 

## 2017-05-16 LAB — IGP, APTIMA HPV
HPV Aptima: NEGATIVE
PAP Smear Comment: 0

## 2017-05-29 ENCOUNTER — Ambulatory Visit
Admission: RE | Admit: 2017-05-29 | Discharge: 2017-05-29 | Disposition: A | Discharge status: Home / Self Care | Payer: BC Managed Care – PPO | Source: Ambulatory Visit | Attending: Obstetrics and Gynecology | Admitting: Obstetrics and Gynecology

## 2017-05-29 DIAGNOSIS — Z9189 Other specified personal risk factors, not elsewhere classified: Secondary | ICD-10-CM

## 2017-05-29 DIAGNOSIS — Z1239 Encounter for other screening for malignant neoplasm of breast: Secondary | ICD-10-CM

## 2017-05-29 DIAGNOSIS — Z803 Family history of malignant neoplasm of breast: Secondary | ICD-10-CM

## 2017-05-29 DIAGNOSIS — Z1231 Encounter for screening mammogram for malignant neoplasm of breast: Secondary | ICD-10-CM | POA: Diagnosis present

## 2017-06-04 ENCOUNTER — Encounter: Payer: Self-pay | Admitting: Obstetrics and Gynecology

## 2017-07-08 ENCOUNTER — Ambulatory Visit (INDEPENDENT_AMBULATORY_CARE_PROVIDER_SITE_OTHER): Payer: BC Managed Care – PPO | Admitting: Nurse Practitioner

## 2017-07-08 ENCOUNTER — Encounter: Payer: Self-pay | Admitting: Nurse Practitioner

## 2017-07-08 VITALS — BP 114/70 | HR 64 | Temp 98.2°F | Ht 62.0 in | Wt 141.2 lb

## 2017-07-08 DIAGNOSIS — E7800 Pure hypercholesterolemia, unspecified: Secondary | ICD-10-CM | POA: Diagnosis not present

## 2017-07-08 DIAGNOSIS — L2082 Flexural eczema: Secondary | ICD-10-CM

## 2017-07-08 MED ORDER — TRIAMCINOLONE ACETONIDE 0.025 % EX OINT: 1.0000 "application " | TOPICAL_OINTMENT | Freq: Two times a day (BID) | CUTANEOUS | 0 refills | Status: AC

## 2017-07-08 MED ORDER — PITAVASTATIN CALCIUM 1 MG PO TABS: 1.0000 mg | ORAL_TABLET | Freq: Every day | ORAL | 5 refills | Status: DC

## 2017-07-08 NOTE — Progress Notes (Signed)
Subjective:    Patient ID: Jamie Stanley, female    DOB: 10/14/1970, 47 y.o.   MRN: [id number]  Jamie Stanley is a 47 y.o. female presenting on 07/08/2017 for Hyperlipidemia   HPI Hyperlipidemia Pt is taking red yeast rice 300 mg one capsule daily as has had statin intolerance.  On red yeast rice, patient does not have arthralgias.  Pt notes had significant increase in pain of her hands with worst pain in left third MCP joint.  Pt has taken daily statin in past with trial and failures of daily atorvastatin, simvastatin, rosuvastatin. She has always taken them daily and has not tried weekly or every other day dosing.  Had myalgias also with Ezetimibe.  - Most recent lipid panel 01/2017  With total cholesterol < 200, but LDL 122 and nonHDL 141.  Prior NMR lipoprofile indicated numerous small dense LDL particles. - High family history rates of MI, CVA, vascular dementia. - Pt denies changes in vision, chest tightness/pressure, palpitations, shortness of breath, leg pain while walking, leg or arm weakness, and sudden loss of speech or loss of consciousness.  - She  reports no regular exercise routine. - Her diet is moderate in salt, low in fat, and moderate in carbohydrates.  Cooks more meals at home, lots of vegetables.  Is not limiting sweets.  Eats fish/salmon at least 2 x weekly.  Rash at nasolabial folds Eczema in past on elbows.  This winter notes painful, itchy, rash at nasolabial folds.  Occasionally is very erythematous with flaky skin.  Moisturizer causes skin to have burning sensation.  Has not used any steroid cream to treat to date.  Social History   Tobacco Use  . Smoking status: Never Smoker  . Smokeless tobacco: Never Used  Substance Use Topics  . Alcohol use: No  . Drug use: No    Review of Systems Per HPI unless specifically indicated above     Objective:    BP 114/70 (BP Location: Right Arm, Patient Position: Sitting, Cuff Size: Normal)   Pulse 64   Temp 98.2 F (36.8  C) (Oral)   Ht 5\' 2"  (1.575 m)   Wt 141 lb 3.2 oz (64 kg)   BMI 25.83 kg/m   Wt Readings from Last 3 Encounters:  07/08/17 141 lb 3.2 oz (64 kg)  05/14/17 140 lb (63.5 kg)  01/21/17 137 lb 12.8 oz (62.5 kg)    Physical Exam  Constitutional: She is oriented to person, place, and time. She appears well-developed and well-nourished. No distress.  HENT:  Head: Normocephalic and atraumatic.  Eyes: Conjunctivae are normal. Pupils are equal, round, and reactive to light.  No arcus senilis  Neck: Normal range of motion. Neck supple. Carotid bruit is not present.  Cardiovascular: Normal rate, regular rhythm, S1 normal, S2 normal, normal heart sounds and intact distal pulses.  Pulmonary/Chest: Effort normal and breath sounds normal. No respiratory distress.  Abdominal: Soft. Bowel sounds are normal. She exhibits no distension and no abdominal bruit. There is no hepatosplenomegaly. There is no tenderness.  Musculoskeletal: She exhibits no edema (pedal).  No noted tendon xanthomas of hands or of achilles tendon.  Neurological: She is alert and oriented to person, place, and time.  Skin: Skin is warm and dry. Rash (erythematous rash at nasolabial folds bilaterally without edema.  Flaky skin surrounding erythematous areas.) noted.  Psychiatric: She has a normal mood and affect. Her behavior is normal. Judgment and thought content normal.  Vitals reviewed.   Results for orders  placed or performed in visit on 05/14/17  IGP, Aptima HPV  Result Value Ref Range   DIAGNOSIS: Comment    Specimen adequacy: Comment    Clinician Provided ICD10 Comment    Performed by: Comment    PAP Smear Comment .    Note: Comment    Test Methodology Comment    HPV Aptima Negative Negative      Assessment & Plan:   Problem List Items Addressed This Visit      Other   Hyperlipidemia - Primary    Uncontrolled for reduction of ASCVD events in patient with significant family history of CAD and CVA.  Single  grandparent with confirmed vascular demential.  NMR lipoprofile collected in 03/2016 indicates higher risk for ASCVD events.  Repeat lipid panel confirms persistent LDL elevation.  Pt was not able to increase physical activity after last lipid check, but continues to work on eating healthy diet with Omega 3 fatty acids, vegetables, and whole grains.  Dietary indiscretions are too much refined sugars.  - Pt has had statin intolerance in past with inability to tolerate atorvastatin, simvastatin, and rosuvastatin at daily dosing.  Intolerance to daily ezetimibe also. - Currently on 300 mg daily red yeast rice - not evidence based ASCVD risk reduction and not likely at a therapeutic dose  Plan: 1. Reviewed options for pitavastatin (Livalo) vs qod or weekly dosing of another statin.  Pt agrees to try again and may consider antiPCSK9 in future.   - START pitavastatin 1 mg tablet once daily.  Is primary prevention, so will use lowest dose tolerated and will increase for LDL goal < 100 initially.  Consider reducing to LDL goal < 70 if well tolerated. 2. Reviewed low glycemic diet, physical activity. Recommend weight loss 5 lbs.   3. Follow up 3 months.      Relevant Medications   Pitavastatin Calcium (LIVALO) 1 MG TABS   Other Relevant Orders   Lipid panel    Other Visit Diagnoses    Flexural eczema       Relevant Medications   triamcinolone (KENALOG) 0.025 % ointment    Subacute eczema worsened in winter months.  Now with nasolabial fold involvement instead of former flexural involvement of extremities.  Pt has not yet used any steroid ointment for the skin.  Plan: 1. Start using Cortisone - 10 OTC twice daily for 7-10 days.  If not effective, may use triamcinolone 0.025% ointment twice daily for 7 days.  Limit use since applying to face.  Cautioned long-term use.  Pt verbalizes understanding. 2. Followup as needed.    Meds ordered this encounter  Medications  . Pitavastatin Calcium (LIVALO) 1  MG TABS    Sig: Take 1 tablet (1 mg total) by mouth daily.    Dispense:  30 tablet    Refill:  5    Order Specific Question:   Supervising Provider    Answer:   Olin Hauser [[id number]]  . triamcinolone (KENALOG) 0.025 % ointment    Sig: Apply 1 application topically 2 (two) times daily for 7 days.    Dispense:  30 g    Refill:  0    Order Specific Question:   Supervising Provider    Answer:   Olin Hauser [[id number]]      Follow up plan: Return in about 3 months (around 10/08/2017) for cholesterol.  [name], DNP, AGPCNP-BC Adult Gerontology Primary Care Nurse Practitioner [facility] 07/08/2017, 1:28  PM

## 2017-07-08 NOTE — Assessment & Plan Note (Signed)
Uncontrolled for reduction of ASCVD events in patient with significant family history of CAD and CVA.  Single grandparent with confirmed vascular demential.  NMR lipoprofile collected in 03/2016 indicates higher risk for ASCVD events.  Repeat lipid panel confirms persistent LDL elevation.  Pt was not able to increase physical activity after last lipid check, but continues to work on eating healthy diet with Omega 3 fatty acids, vegetables, and whole grains.  Dietary indiscretions are too much refined sugars.  - Pt has had statin intolerance in past with inability to tolerate atorvastatin, simvastatin, and rosuvastatin at daily dosing.  Intolerance to daily ezetimibe also. - Currently on 300 mg daily red yeast rice - not evidence based ASCVD risk reduction and not likely at a therapeutic dose  Plan: 1. Reviewed options for pitavastatin (Livalo) vs qod or weekly dosing of another statin.  Pt agrees to try again and may consider antiPCSK9 in future.   - START pitavastatin 1 mg tablet once daily.  Is primary prevention, so will use lowest dose tolerated and will increase for LDL goal < 100 initially.  Consider reducing to LDL goal < 70 if well tolerated. 2. Reviewed low glycemic diet, physical activity. Recommend weight loss 5 lbs.   3. Follow up 3 months.

## 2017-07-08 NOTE — Patient Instructions (Addendum)
Tatisha,  Thank you for coming in to clinic today.  1. START taking Livalo 1 mg tablet once daily.  2. Increase your physical activity until you are increasing your heart rate for 30 minutes on most days of the week.  3. Reduce sweets and refined sugars.  You will be due for [place].  This means you should eat no food or drink after midnight.  Drink only water or coffee without cream/sugar on the morning of your lab visit. - Please go ahead and schedule a "Lab Only" visit in the morning at the clinic for lab draw 3 days before your next office visit.  - Your results will be available about 2-3 days after blood draw.  If you have set up a MyChart account, you can can log in to MyChart online to view your results and a brief explanation. Also, we can discuss your results together at your next office visit if you would like.   Please schedule a follow-up appointment with [name], AGNP. Return in about 3 months (around 10/08/2017) for cholesterol.  If you have any other questions or concerns, please feel free to call the clinic or send a message through [facility]. You may also schedule an earlier appointment if necessary.  You will receive a survey after today's visit either digitally by e-mail or paper by [employer]. Your experiences and feedback matter to [place].  Please respond so we know how we are doing as we provide care for you.   [name], DNP, AGNP-BC Adult Gerontology Nurse Practitioner [facility]

## 2017-07-10 ENCOUNTER — Other Ambulatory Visit: Payer: Self-pay

## 2017-07-11 ENCOUNTER — Encounter: Payer: Self-pay | Admitting: Nurse Practitioner

## 2017-07-15 ENCOUNTER — Other Ambulatory Visit: Payer: Self-pay

## 2017-07-15 DIAGNOSIS — E7800 Pure hypercholesterolemia, unspecified: Secondary | ICD-10-CM

## 2017-07-15 DIAGNOSIS — T466X5A Adverse effect of antihyperlipidemic and antiarteriosclerotic drugs, initial encounter: Secondary | ICD-10-CM

## 2017-07-15 DIAGNOSIS — Z789 Other specified health status: Secondary | ICD-10-CM

## 2017-07-15 DIAGNOSIS — M791 Myalgia, unspecified site: Secondary | ICD-10-CM

## 2017-07-16 MED ORDER — SIMVASTATIN 10 MG PO TABS: ORAL_TABLET | ORAL | 5 refills | Status: DC

## 2017-07-22 ENCOUNTER — Ambulatory Visit: Payer: BC Managed Care – PPO | Admitting: Nurse Practitioner

## 2017-09-03 ENCOUNTER — Ambulatory Visit: Payer: BC Managed Care – PPO | Admitting: Nurse Practitioner

## 2017-09-03 ENCOUNTER — Encounter: Payer: Self-pay | Admitting: Nurse Practitioner

## 2017-09-03 VITALS — BP 110/68 | HR 61 | Resp 14 | Ht 62.0 in | Wt 138.8 lb

## 2017-09-03 DIAGNOSIS — D229 Melanocytic nevi, unspecified: Secondary | ICD-10-CM

## 2017-09-03 NOTE — Progress Notes (Signed)
   Subjective:    Patient ID: Jamie Stanley, female    DOB: [birth year], 47 y.o.   MRN: [id number]  Jamie Stanley is a 47 y.o. female presenting on 09/03/2017 for No chief complaint on file.   HPI Sore on Ear  Mole on left ear in 2016 - not bothersome and was flat.  In last 3-4 weeks approximately, she has noted increased soreness.  Is also now raised and growing, color is also changing. - Had peeling skin last week, Felt hardened during this and has returned to normal texture.  Increased soreness since peeling skin. - Reports frequent sun exposure in her teens and 63s without sunscreen - Has had prior mole removed at hairline on face that was non-cancerous.  Interval hyperlipidemia update: Simvastatin 1 tab twice weekly.  She notes experiencing arthralgia with any higher doses.  Has been at this does x 2 weeks approximately.  Has followup in June (about 4 weeks) for labs and office visit.  Social History   Tobacco Use  . Smoking status: Never Smoker  . Smokeless tobacco: Never Used  Substance Use Topics  . Alcohol use: No  . Drug use: No    Review of Systems Per HPI unless specifically indicated above     Objective:    BP 110/68 (BP Location: Left Arm, Patient Position: Sitting, Cuff Size: Normal)   Pulse 61   Resp 14   Ht 5\' 2"  (1.575 m)   Wt 138 lb 12.8 oz (63 kg)   BMI 25.39 kg/m   Wt Readings from Last 3 Encounters:  09/03/17 138 lb 12.8 oz (63 kg)  07/08/17 141 lb 3.2 oz (64 kg)  05/14/17 140 lb (63.5 kg)    Physical Exam  Constitutional: She is oriented to person, place, and time. She appears well-developed and well-nourished. No distress.  HENT:  Head: Normocephalic and atraumatic.  Ears:  Pulmonary/Chest: Effort normal.  Neurological: She is alert and oriented to person, place, and time.  Skin: Skin is warm and dry. Capillary refill takes less than 2 seconds.  Psychiatric: She has a normal mood and affect. Her behavior is normal. Judgment and thought content  normal.  Vitals reviewed.     Results for orders placed or performed in visit on 05/14/17  IGP, Aptima HPV  Result Value Ref Range   DIAGNOSIS: Comment    Specimen adequacy: Comment    Clinician Provided ICD10 Comment    Performed by: Comment    PAP Smear Comment .    Note: Comment    Test Methodology Comment    HPV Aptima Negative Negative      Assessment & Plan:   Problem List Items Addressed This Visit    None    Visit Diagnoses    Nevus    -  Primary   Relevant Orders   Ambulatory referral to Dermatology     Likely is seborrheic keratosis, but cannot exclude BCC or SCC possibility with pain, changing course of nevus and new scaly skin.  Pt with history of extended sun exposure.  Plan: 1. Referral dermatology. 2. Encouraged pt to actively use sunscreen, seek shade, wear hats with brims to protect skin. 3. Followup as needed.  Follow up plan: Return if symptoms worsen or fail to improve.  [name], DNP, AGPCNP-BC Adult Gerontology Primary Care Nurse Practitioner [facility] 09/03/2017, 8:09 AM

## 2017-09-03 NOTE — Patient Instructions (Addendum)
Jamie Stanley,   Thank you for coming in to clinic today.  1. Wear regular suncreen when out in sun.  Cumulative time in sun counts (5 min + 5 min + 10 min = daily total of 20 min) - SPF 30 or higher - UVA and UVB protection  2. Keep your dermatology appointment.   Please schedule a follow-up appointment with [name], AGNP. Return if symptoms worsen or fail to improve.  If you have any other questions or concerns, please feel free to call the clinic or send a message through [place]. You may also schedule an earlier appointment if necessary.  You will receive a survey after today's visit either digitally by e-mail or paper by [employer]. Your experiences and feedback matter to [place].  Please respond so we know how we are doing as we provide care for you.   [name], DNP, AGNP-BC Adult Gerontology Nurse Practitioner [place]

## 2017-10-03 ENCOUNTER — Other Ambulatory Visit: Payer: Self-pay | Admitting: *Deleted

## 2017-10-03 DIAGNOSIS — E7800 Pure hypercholesterolemia, unspecified: Secondary | ICD-10-CM

## 2017-10-04 LAB — LIPID PANEL
Cholesterol: 175 mg/dL (ref <200)
HDL: 53 mg/dL (ref >50)
LDL Cholesterol (Calc): 103 mg/dL (calc) — ABNORMAL HIGH
Non-HDL Cholesterol (Calc): 122 mg/dL (calc) (ref <130)
Total CHOL/HDL Ratio: 3.3 (calc) (ref <5.0)
Triglycerides: 101 mg/dL (ref <150)

## 2017-10-07 ENCOUNTER — Ambulatory Visit: Payer: BC Managed Care – PPO | Admitting: Nurse Practitioner

## 2017-10-09 ENCOUNTER — Other Ambulatory Visit: Payer: Self-pay

## 2017-10-09 ENCOUNTER — Encounter: Payer: Self-pay | Admitting: Nurse Practitioner

## 2017-10-09 ENCOUNTER — Ambulatory Visit (INDEPENDENT_AMBULATORY_CARE_PROVIDER_SITE_OTHER): Payer: BC Managed Care – PPO | Admitting: Nurse Practitioner

## 2017-10-09 VITALS — BP 110/68 | HR 70 | Temp 98.3°F | Ht 62.0 in | Wt 138.8 lb

## 2017-10-09 DIAGNOSIS — H9192 Unspecified hearing loss, left ear: Secondary | ICD-10-CM | POA: Diagnosis not present

## 2017-10-09 DIAGNOSIS — H9202 Otalgia, left ear: Secondary | ICD-10-CM | POA: Diagnosis not present

## 2017-10-09 DIAGNOSIS — Z789 Other specified health status: Secondary | ICD-10-CM

## 2017-10-09 DIAGNOSIS — E7800 Pure hypercholesterolemia, unspecified: Secondary | ICD-10-CM | POA: Diagnosis not present

## 2017-10-09 DIAGNOSIS — T466X5A Adverse effect of antihyperlipidemic and antiarteriosclerotic drugs, initial encounter: Secondary | ICD-10-CM

## 2017-10-09 DIAGNOSIS — M791 Myalgia, unspecified site: Secondary | ICD-10-CM

## 2017-10-09 DIAGNOSIS — G8929 Other chronic pain: Secondary | ICD-10-CM

## 2017-10-09 MED ORDER — SIMVASTATIN 10 MG PO TABS: ORAL_TABLET | ORAL | 5 refills | Status: DC

## 2017-10-09 NOTE — Progress Notes (Signed)
Subjective:    Patient ID: Jamie Stanley, female    DOB: 11-16-1970, 47 y.o.   MRN: [id number]  Jamie Stanley is a 47 y.o. female presenting on 10/09/2017 for Hyperlipidemia (want to discuss bloodwork )   HPI Left ear pain Seborrheic Keratosis removed from pinna 2 weeks ago.  Intermittent pain with touching ear persists.  Pain is described as burning with light touch.  Nothing has made pain better.  Wound is healed, bu tpain persists.  Hyperlipidemia Pt has stopped taking red yeast rice.  Is taking simvastatin 10 mg twice weekly on Sun and Wed.  She is starting to notice very mild arthralgias in hands, but these are tolerable.  Is working to improve diet and increase activity as well. - Pt denies changes in vision, chest tightness/pressure, palpitations, shortness of breath, leg pain while walking, leg or arm weakness, and sudden loss of speech or loss of consciousness.   Social History   Tobacco Use  . Smoking status: Never Smoker  . Smokeless tobacco: Never Used  Substance Use Topics  . Alcohol use: No  . Drug use: No    Review of Systems Per HPI unless specifically indicated above     Objective:    BP 110/68 (BP Location: Right Arm, Patient Position: Sitting, Cuff Size: Normal)   Pulse 70   Temp 98.3 F (36.8 C) (Oral)   Ht 5\' 2"  (1.575 m)   Wt 138 lb 12.8 oz (63 kg)   LMP  (Within Months) Comment: pt taking birth control and not really have menstrual cycle  BMI 25.39 kg/m   Wt Readings from Last 3 Encounters:  10/09/17 138 lb 12.8 oz (63 kg)  09/03/17 138 lb 12.8 oz (63 kg)  07/08/17 141 lb 3.2 oz (64 kg)    Physical Exam  Constitutional: She is oriented to person, place, and time. She appears well-developed and well-nourished. No distress.  HENT:  Head: Normocephalic and atraumatic.  Neck: Normal range of motion. Neck supple. Carotid bruit is not present.  Cardiovascular: Normal rate, regular rhythm, S1 normal, S2 normal, normal heart sounds and intact distal  pulses.  Pulmonary/Chest: Effort normal and breath sounds normal. No respiratory distress.  Musculoskeletal: She exhibits no edema (pedal).  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  Larger in width than 1 and 5th joints and is also location of pain with statin.  Neurological: She is alert and oriented to person, place, and time.  Skin: Skin is warm and dry. Capillary refill takes less than 2 seconds.  Psychiatric: She has a normal mood and affect. Her behavior is normal. Judgment and thought content normal.  Vitals reviewed.  Results for orders placed or performed in visit on 10/03/17  Lipid panel  Result Value Ref Range   Cholesterol 175 <200 mg/dL   HDL 53 >50 mg/dL   Triglycerides 101 <150 mg/dL   LDL Cholesterol (Calc) 103 (H) mg/dL (calc)   Total CHOL/HDL Ratio 3.3 <5.0 (calc)   Non-HDL Cholesterol (Calc) 122 <130 mg/dL (calc)      Assessment & Plan:   Problem List Items Addressed This Visit      Other   Hyperlipidemia Stable today on exam.  Medications tolerated well at very low dose.  Continue with changes. increase to take 5 mg simvastatin on Friday as well as 10 mg twice weekly .  Refills provided.  Check labs today. Followup 3 months.    Relevant Medications   simvastatin (ZOCOR) 10 MG tablet   Other  Relevant Orders   Lipid panel   COMPLETE METABOLIC PANEL WITH GFR    Other Visit Diagnoses    Chronic left ear pain    -  Primary Chronic pain left ear prior to removal of seborrheic keratosis that persists after.  Likely related to nerve damage.  With association to tinnitus and hearing loss, would recommend ENT referral rule out neuralgia.   Relevant Orders   Ambulatory referral to ENT   Hearing loss of left ear, unspecified hearing loss type       Relevant Orders   Ambulatory referral to ENT   Statin intolerance     See plan hyperlipidemia above.  Take max tolerated statin.   Relevant Medications   simvastatin (ZOCOR) 10 MG tablet   Myalgia due to statin        Relevant Medications   simvastatin (ZOCOR) 10 MG tablet      Meds ordered this encounter  Medications  . simvastatin (ZOCOR) 10 MG tablet    Sig: Take 1/2 tablet once weekly for 14 days. Then, take 1/2 tablet twice weekly for 14 days.  Increase until taking daily without side effects.    Dispense:  15 tablet    Refill:  5    Order Specific Question:   Supervising Provider    Answer:   Olin Hauser [[id number]]    Follow up plan: Return in about 6 months (around 04/10/2018) for cholesterol.  [name], DNP, AGPCNP-BC Adult Gerontology Primary Care Nurse Practitioner [facility] 10/09/2017, 9:13 AM

## 2017-10-09 NOTE — Patient Instructions (Signed)
Mirian Mo,   Thank you for coming in to clinic today.  1. [facility] - Referral placed for [facility].  Keep appointment if still having pain and hearing loss in next 4 weeks.    2. Continue simvastatin 10 mg twice weekly.  Try adding 5 mg on Friday.  You will be due for [place].  This means you should eat no food or drink after midnight.  Drink only water or coffee without cream/sugar on the morning of your lab visit. - Please go ahead and schedule a "Lab Only" visit in the morning at the clinic for lab draw 3 days before your next office visit. - Your results will be available about 2-3 days after blood draw.  If you have set up a MyChart account, you can can log in to MyChart online to view your results and a brief explanation. Also, we can discuss your results together at your next office visit if you would like.   Please schedule a follow-up appointment with [name], AGNP. Return in about 6 months (around 04/10/2018) for cholesterol.  If you have any other questions or concerns, please feel free to call the clinic or send a message through [place]. You may also schedule an earlier appointment if necessary.  You will receive a survey after today's visit either digitally by e-mail or paper by [employer]. Your experiences and feedback matter to [place].  Please respond so we know how we are doing as we provide care for you.   [name], DNP, AGNP-BC Adult Gerontology Nurse Practitioner [place]

## 2017-12-10 DIAGNOSIS — D239 Other benign neoplasm of skin, unspecified: Secondary | ICD-10-CM

## 2017-12-10 HISTORY — DX: Other benign neoplasm of skin, unspecified: D23.9

## 2017-12-27 ENCOUNTER — Encounter: Payer: Self-pay | Admitting: Nurse Practitioner

## 2017-12-28 ENCOUNTER — Other Ambulatory Visit: Payer: Self-pay | Admitting: Nurse Practitioner

## 2017-12-28 DIAGNOSIS — E7800 Pure hypercholesterolemia, unspecified: Secondary | ICD-10-CM

## 2017-12-28 DIAGNOSIS — Z789 Other specified health status: Secondary | ICD-10-CM

## 2017-12-28 DIAGNOSIS — M791 Myalgia, unspecified site: Secondary | ICD-10-CM

## 2017-12-28 DIAGNOSIS — T466X5A Adverse effect of antihyperlipidemic and antiarteriosclerotic drugs, initial encounter: Secondary | ICD-10-CM

## 2018-02-03 ENCOUNTER — Other Ambulatory Visit: Payer: Self-pay

## 2018-02-03 ENCOUNTER — Encounter: Payer: Self-pay | Admitting: Nurse Practitioner

## 2018-02-03 ENCOUNTER — Ambulatory Visit (INDEPENDENT_AMBULATORY_CARE_PROVIDER_SITE_OTHER): Payer: BC Managed Care – PPO | Admitting: Nurse Practitioner

## 2018-02-03 VITALS — BP 134/79 | HR 82 | Temp 98.5°F | Ht 62.0 in | Wt 141.2 lb

## 2018-02-03 DIAGNOSIS — J019 Acute sinusitis, unspecified: Secondary | ICD-10-CM | POA: Diagnosis not present

## 2018-02-03 MED ORDER — IPRATROPIUM BROMIDE 0.06 % NA SOLN: 2.0000 | Freq: Four times a day (QID) | NASAL | 12 refills | Status: DC

## 2018-02-03 NOTE — Progress Notes (Signed)
Subjective:    Patient ID: Jamie Stanley, female    DOB: 09-Dec-1970, 47 y.o.   MRN: [id number]  Allisyn Kunz is a 47 y.o. female presenting on 02/03/2018 for Sinusitis (facial pressure, nasal congestion, low grade fever 99,teeth pain  and sore throat x 3 days )   HPI  URI symptoms Patient presents today with onset of URI symptoms 3 days ago.  She reports she started feeling poorly Sunday afternoon, fairly suddenly with scratchy throat and malaise.  Monday am had fever and worsening symptoms. Symptoms include significant facial pressure, nasal congestion, tooth pain, sore throat, ear pressure.  She has also had low-grade fever, nighttime chills/sweats.  Tylenol helps reduce fever.  Sent home from work yesterday 2/2 symptoms.  - Also took Tylenol cold at 8am today without relief. - Patient states she feels increased sinus pressure from "the top of my head to my neck." - Denies nausea, vomiting, or diarrhea. 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 - No other recent URI symptoms or allergy symptoms.    Social History   Tobacco Use  . Smoking status: Never Smoker  . Smokeless tobacco: Never Used  Substance Use Topics  . Alcohol use: No  . Drug use: No    Review of Systems Per HPI unless specifically indicated above     Objective:    BP 134/79 (BP Location: Right Arm, Patient Position: Sitting, Cuff Size: Normal)   Pulse 82   Temp 98.5 F (36.9 C) (Oral)   Ht 5\' 2"  (1.575 m)   Wt 141 lb 3.2 oz (64 kg)   BMI 25.83 kg/m   Wt Readings from Last 3 Encounters:  02/03/18 141 lb 3.2 oz (64 kg)  10/09/17 138 lb 12.8 oz (63 kg)  09/03/17 138 lb 12.8 oz (63 kg)    Physical Exam  Constitutional: She appears well-developed and well-nourished. She appears ill. No distress.  HENT:  Head: Normocephalic and atraumatic.  Right Ear: Hearing, tympanic membrane, external ear and ear canal normal.  Left Ear: Hearing,  tympanic membrane, external ear and ear canal normal.  Nose: Mucosal edema and rhinorrhea present. Right sinus exhibits maxillary sinus tenderness (exquisitely tender - pt pulls away) and frontal sinus tenderness (exquisitely tender - pt pulls away). Left sinus exhibits maxillary sinus tenderness (exquisitely tender - pt pulls away) and frontal sinus tenderness (exquisitely tender - pt pulls away).  Mouth/Throat: Uvula is midline and mucous membranes are normal. Oropharyngeal exudate (clear secretions), posterior oropharyngeal edema (cobblestoning) and posterior oropharyngeal erythema (mildly injected) present. Tonsils are 0 on the right. Tonsils are 0 on the left. No tonsillar exudate.  Eyes: Pupils are equal, round, and reactive to light. Conjunctivae and EOM are normal. Right eye exhibits no discharge. Left eye exhibits no discharge.  Neck: Normal range of motion. Neck supple.  Cardiovascular: Normal rate, regular rhythm, S1 normal, S2 normal, normal heart sounds and intact distal pulses.  Pulmonary/Chest: Effort normal and breath sounds normal. No respiratory distress.  Lymphadenopathy:       Head (right side): No submental, no submandibular, no tonsillar, no preauricular and no posterior auricular adenopathy present.       Head (left side): No submental, no submandibular, no tonsillar, no preauricular and no posterior auricular adenopathy present.    She has no cervical adenopathy.  Neurological: She is alert.  Skin: Skin is warm and dry. Capillary refill takes less than  2 seconds. She is not diaphoretic.  Psychiatric: She has a normal mood and affect. Her behavior is normal. Judgment and thought content normal.  Vitals reviewed.    Results for orders placed or performed in visit on 10/03/17  Lipid panel  Result Value Ref Range   Cholesterol 175 <200 mg/dL   HDL 53 >50 mg/dL   Triglycerides 101 <150 mg/dL   LDL Cholesterol (Calc) 103 (H) mg/dL (calc)   Total CHOL/HDL Ratio 3.3 <5.0  (calc)   Non-HDL Cholesterol (Calc) 122 <130 mg/dL (calc)      Assessment & Plan:   Problem List Items Addressed This Visit    None    Visit Diagnoses    Acute rhinosinusitis    -  Primary   Relevant Medications   ipratropium (ATROVENT) 0.06 % nasal spray    Acute illness. Fever responsive to NSAIDs and tylenol.  Symptoms not worsening. Consistent with viral illness x 3 days with no known sick contacts and no identifiable focal infections of ears, nose, throat.  Tooth pain concerning for sinusitis, but again with duration of symptoms at 3 days is most likely viral.  Plan: 1. Reassurance, likely self-limited with cough lasting up to few weeks - Start Atrovent nasal spray decongestant 2 sprays each nostril up to 4 times daily for 5-7 days - Start anti-histamine Loratadine 10mg  daily,  - also can use Flonase 2 sprays each nostril daily for up to 4-6 weeks - Start Mucinex-DM OTC up to 7-10 days then stop 2. Supportive care with nasal saline, warm herbal tea with honey, 3. Improve hydration 4. Tylenol / Motrin PRN fevers 5. Return criteria given.  Due to patient persistence for more treatment despite antibiotic resistance information and desire to have treatment available if she leaves on planned vacation to the [place], will send antibiotic on Thursday for worsening or persistent symptoms. Patient to call.  Meds ordered this encounter  Medications  . ipratropium (ATROVENT) 0.06 % nasal spray    Sig: Place 2 sprays into both nostrils 4 (four) times daily.    Dispense:  15 mL    Refill:  12    Order Specific Question:   Supervising Provider    Answer:   Olin Hauser [[id number]]    Follow up plan: Return 5-7 days if symptoms worsen or fail to improve.  [name], DNP, AGPCNP-BC Adult Gerontology Primary Care Nurse Practitioner [facility] 02/03/2018, 11:54 AM

## 2018-02-03 NOTE — Patient Instructions (Addendum)
Jamie Stanley,   Thank you for coming in to clinic today.  1. It sounds like you have a Upper Respiratory Virus - this will most likely run it's course in 7 to 10 days. Recommend good hand washing. - Start Atrovent nasal spray decongestant 2 sprays each nostril up to 4 times daily for 5-7 days - Start anti-histamine loratadine 10mg  daily, also can use Flonase 2 sprays each nostril daily for up to 4-6 weeks - If congestion is worse, start OTC Mucinex (or may try Mucinex-DM for cough) up to 7-10 days then stop - Drink plenty of fluids to improve congestion - You may try over the counter Nasal Saline spray (Simply Saline, Ocean Spray) as needed to reduce congestion. - Drink warm herbal tea with honey for sore throat. - Start taking Tylenol extra strength 1 to 2 tablets every 6-8 hours for aches or fever/chills for next few days as needed.  Do not take more than 3,000 mg in 24 hours from all medicines.  May take Ibuprofen as well if tolerated 200-400mg  every 8 hours as needed.  If symptoms significantly worsening with persistent fevers/chills despite tylenol/ibpurofen, nausea, vomiting unable to tolerate food/fluids or medicine, body aches, or shortness of breath, sinus pain pressure or worsening productive cough, then follow-up for re-evaluation, may seek more immediate care at Urgent Care or ED if more concerned for emergency.  Call back for antibiotics if Worsening symptoms with daytime fever, more facial pressure or tooth pain by Thursday.   Please schedule a follow-up appointment with [name], AGNP. Return 5-7 days if symptoms worsen or fail to improve.  If you have any other questions or concerns, please feel free to call the clinic or send a message through [place]. You may also schedule an earlier appointment if necessary.  You will receive a survey after today's visit either digitally by e-mail or paper by [employer]. Your experiences and feedback matter to [place].  Please respond so we know  how we are doing as we provide care for you.   [name], DNP, AGNP-BC Adult Gerontology Nurse Practitioner [place]

## 2018-02-05 ENCOUNTER — Telehealth: Payer: Self-pay | Admitting: Nurse Practitioner

## 2018-02-05 ENCOUNTER — Encounter: Payer: Self-pay | Admitting: Nurse Practitioner

## 2018-02-05 DIAGNOSIS — J0140 Acute pansinusitis, unspecified: Secondary | ICD-10-CM

## 2018-02-05 MED ORDER — DOXYCYCLINE HYCLATE 100 MG PO TABS: 100.0000 mg | ORAL_TABLET | Freq: Two times a day (BID) | ORAL | 0 refills | Status: DC

## 2018-02-05 NOTE — Telephone Encounter (Signed)
Doxycycline sent today.

## 2018-02-05 NOTE — Telephone Encounter (Signed)
Pt was told if she wasn't any better a prescription for antibiotics could be sent to [facility] in [place]

## 2018-02-05 NOTE — Telephone Encounter (Signed)
The pt was notified. No questions or concerns. 

## 2018-02-05 NOTE — Telephone Encounter (Signed)
The pt was notified by phone about the prescription being sent over to her pharmacy.

## 2018-04-08 ENCOUNTER — Telehealth: Payer: Self-pay

## 2018-04-08 DIAGNOSIS — Z3041 Encounter for surveillance of contraceptive pills: Secondary | ICD-10-CM

## 2018-04-08 MED ORDER — MICROGESTIN FE 1/20 1-20 MG-MCG PO TABS: 1.0000 | ORAL_TABLET | Freq: Every day | ORAL | 0 refills | Status: DC

## 2018-04-08 NOTE — Telephone Encounter (Signed)
Pt scheduled for 1/27 and needs bc refill sent to [facility]. [phone] Pt aware eRx'd.

## 2018-04-15 ENCOUNTER — Other Ambulatory Visit: Payer: Self-pay | Admitting: Obstetrics and Gynecology

## 2018-04-15 DIAGNOSIS — Z3041 Encounter for surveillance of contraceptive pills: Secondary | ICD-10-CM

## 2018-05-23 ENCOUNTER — Other Ambulatory Visit: Payer: Self-pay | Admitting: Nurse Practitioner

## 2018-05-23 DIAGNOSIS — Z789 Other specified health status: Secondary | ICD-10-CM

## 2018-05-23 DIAGNOSIS — T466X5A Adverse effect of antihyperlipidemic and antiarteriosclerotic drugs, initial encounter: Secondary | ICD-10-CM

## 2018-05-23 DIAGNOSIS — E7800 Pure hypercholesterolemia, unspecified: Secondary | ICD-10-CM

## 2018-05-23 DIAGNOSIS — M791 Myalgia, unspecified site: Secondary | ICD-10-CM

## 2018-05-24 NOTE — Progress Notes (Signed)
 PCP:  Kennedy, Lauren Renee, NP   Chief Complaint  Patient presents with  . Gynecologic Exam     HPI:      [name] is a 48 y.o. [id number] who LMP was Patient's last menstrual period was 05/11/2018 (approximate)., presents today for her annual examination.  Her menses are 1 day light spotting with OCPs. Hx of menorrhagia without OCPs. Mom and sister went though menopause is mid-50s. Dysmenorrhea none. She does not have intermenstrual bleeding. Has menstrual headaches, improved with NSAIDs. Pt does have vasomotor sx.  Sex activity: single partner, contraception - OCP (estrogen/progesterone). Pt with increased libido but husband with ED, most likely due to type 1 DM. Last Pap: 05/14/17  Results were: no abnormalities /neg HPV DNA  Hx of STDs: none  Last mammogram: 05/29/17  Results were: normal--routine follow-up in 12 months There is a FH of breast cancer in her mom, mat grt aunt and pat grt aunt.  There is a FH of ovarian cancer in her MGM. Pt is My Risk neg 2016, her mom is BRCA neg 2015. IBIS=33%. The patient does do self-breast exams. Is taking Vit D supp. Has not had breast MRI.  Tobacco use: The patient denies current or previous tobacco use. Alcohol use: none No drug use.  Exercise: not active  She does get adequate calcium and Vitamin D in her diet.  Labs/hyperlipidemia with PCP now. Colonoscopy: never  Past Medical History:  Diagnosis Date  . Allergy   . Asthma   . BRCA negative 2016   MyRisk neg  . Family history of breast cancer   . Family history of ovarian cancer   . Frequent headaches   . Hyperlipidemia   . Increased risk of breast cancer 2016   IBIS=33%  . Physiological ovarian cysts     Past Surgical History:  Procedure Laterality Date  . APPENDECTOMY    . BIOPSY BREAST     left   . BREAST BIOPSY Left 2012   benign  . INGUINAL HERNIA REPAIR      Family History  Problem Relation Age of Onset  . Hypertension Mother   . Hyperlipidemia Mother    . Breast cancer Mother 66       ER/PR type BRCA Neg  . AAA (abdominal aortic aneurysm) Mother   . Hypothyroidism Mother   . Hypertension Father   . Hyperlipidemia Father   . Glaucoma Father   . Heart disease Paternal Grandfather 58  . Diabetes Paternal Grandfather   . Ovarian cancer Maternal Grandmother 65  . Hypothyroidism Maternal Grandmother   . Hypothyroidism Sister   . Hypothyroidism Paternal Grandmother   . Diabetes Paternal Grandmother   . Breast cancer Other 70  . Breast cancer Other 70    Social History   Socioeconomic History  . Marital status: Married    Spouse name: Not on file  . Number of children: Not on file  . Years of education: Not on file  . Highest education level: Not on file  Occupational History  . Not on file  Social Needs  . Financial resource strain: Not on file  . Food insecurity:    Worry: Not on file    Inability: Not on file  . Transportation needs:    Medical: Not on file    Non-medical: Not on file  Tobacco Use  . Smoking status: Never Smoker  . Smokeless tobacco: Never Used  Substance and Sexual Activity  . Alcohol use: No  .   Drug use: No  . Sexual activity: Yes    Birth control/protection: Pill  Lifestyle  . Physical activity:    Days per week: 0 days    Minutes per session: 0 min  . Stress: Only a little  Relationships  . Social connections:    Talks on phone: More than three times a week    Gets together: More than three times a week    Attends religious service: More than 4 times per year    Active member of club or organization: Yes    Attends meetings of clubs or organizations: 1 to 4 times per year    Relationship status: Married  . Intimate partner violence:    Fear of current or ex partner: No    Emotionally abused: No    Physically abused: No    Forced sexual activity: No  Other Topics Concern  . Not on file  Social History Narrative  . Not on file    Current Meds  Medication Sig  . acetaminophen  (TYLENOL) 325 MG tablet Take 650 mg by mouth every 6 (six) hours as needed.  Marland Kitchen ibuprofen (ADVIL,MOTRIN) 800 MG tablet Take 800 mg by mouth every 8 (eight) hours as needed.  . loratadine (CLARITIN) 10 MG tablet Take 10 mg by mouth daily.  . Red Yeast Rice Extract 300 MG CAPS Take by mouth.  . simvastatin (ZOCOR) 10 MG tablet TAKE 1 TABLET BY MOUTH 3 DAYS PER WEEK.     ROS:  Review of Systems  Constitutional: Positive for fatigue. Negative for fever and unexpected weight change.  Respiratory: Negative for cough, shortness of breath and wheezing.   Cardiovascular: Negative for chest pain, palpitations and leg swelling.  Gastrointestinal: Negative for blood in stool, constipation, diarrhea, nausea and vomiting.  Endocrine: Negative for cold intolerance, heat intolerance and polyuria.  Genitourinary: Negative for dyspareunia, dysuria, flank pain, frequency, genital sores, hematuria, menstrual problem, pelvic pain, urgency, vaginal bleeding, vaginal discharge and vaginal pain.  Musculoskeletal: Negative for back pain, joint swelling and myalgias.  Skin: Negative for rash.  Neurological: Positive for headaches. Negative for dizziness, syncope, light-headedness and numbness.  Hematological: Negative for adenopathy.  Psychiatric/Behavioral: Negative for agitation, confusion, sleep disturbance and suicidal ideas. The patient is not nervous/anxious.      Objective: BP 130/80   Pulse 90   Ht 5' 2" (1.575 m)   Wt 146 lb (66.2 kg)   LMP 05/11/2018 (Approximate)   BMI 26.70 kg/m    Physical Exam Constitutional:      Appearance: She is well-developed.  Genitourinary:     Vulva, vagina, cervix, uterus, right adnexa, left adnexa and rectum normal.     No vulval lesion or tenderness noted.     No vaginal discharge, erythema or tenderness.     No cervical polyp.     Uterus is not enlarged or tender.     No right or left adnexal mass present.     Right adnexa not tender.     Left adnexa not  tender.  Rectum:     Guaiac result negative.     No tenderness.  Neck:     Musculoskeletal: Normal range of motion.     Thyroid: No thyromegaly.  Cardiovascular:     Rate and Rhythm: Normal rate and regular rhythm.     Heart sounds: Normal heart sounds. No murmur.  Pulmonary:     Effort: Pulmonary effort is normal.     Breath sounds: Normal breath sounds.  Chest:     Breasts:        Right: No mass, nipple discharge, skin change or tenderness.        Left: No mass, nipple discharge, skin change or tenderness.  Abdominal:     Palpations: Abdomen is soft.     Tenderness: There is no abdominal tenderness. There is no guarding.  Musculoskeletal: Normal range of motion.  Neurological:     Mental Status: She is alert and oriented to person, place, and time.     Cranial Nerves: No cranial nerve deficit.  Psychiatric:        Behavior: Behavior normal.  Vitals signs reviewed.    Results for orders placed or performed in visit on 05/25/18 (from the past 24 hour(s))  POCT Occult Blood Stool     Status: Normal   Collection Time: 05/25/18 10:56 AM  Result Value Ref Range   Fecal Occult Blood, POC Negative Negative   Card #1 Date     Card #2 Fecal Occult Blod, POC     Card #2 Date     Card #3 Fecal Occult Blood, POC     Card #3 Date       Assessment/Plan: Encounter for annual routine gynecological examination  Encounter for surveillance of contraceptive pills - Discussed coming off given age and light periods, but pt wants to continue this yr. Rx RF. Will re-eval next yr. - Plan: MICROGESTIN FE 1/20 1-20 MG-MCG tablet  Screening for breast cancer - Pt to sched mammo - Plan: MM 3D SCREEN BREAST BILATERAL  Screening for colon cancer - Neg FOBT. Discussed scr colonoscopy due to age. Pt to call for ref when desires.  - Plan: POCT Occult Blood Stool  Family history of breast cancer  Increased risk of breast cancer - Cont SBE, yearly CBE and mammos. Discussed scr breast MRI--pt to  call to sched if desires. Cont Vit D supp.  Meds ordered this encounter  Medications  . MICROGESTIN FE 1/20 1-20 MG-MCG tablet    Sig: Take 1 tablet by mouth daily.    Dispense:  3 Package    Refill:  3    Order Specific Question:   Supervising Provider    Answer:   Gae Dry [[id number]]             GYN counsel breast self exam, mammography screening, adequate intake of calcium and vitamin D, diet and exercise     F/U  Return in about 1 year (around 05/26/2019).   B. , PA-C 05/25/2018 10:57 AM

## 2018-05-25 ENCOUNTER — Encounter: Payer: Self-pay | Admitting: Obstetrics and Gynecology

## 2018-05-25 ENCOUNTER — Ambulatory Visit (INDEPENDENT_AMBULATORY_CARE_PROVIDER_SITE_OTHER): Payer: BC Managed Care – PPO | Admitting: Obstetrics and Gynecology

## 2018-05-25 VITALS — BP 130/80 | HR 90 | Ht 62.0 in | Wt 146.0 lb

## 2018-05-25 DIAGNOSIS — Z3041 Encounter for surveillance of contraceptive pills: Secondary | ICD-10-CM

## 2018-05-25 DIAGNOSIS — Z01419 Encounter for gynecological examination (general) (routine) without abnormal findings: Secondary | ICD-10-CM

## 2018-05-25 DIAGNOSIS — Z1211 Encounter for screening for malignant neoplasm of colon: Secondary | ICD-10-CM | POA: Diagnosis not present

## 2018-05-25 DIAGNOSIS — Z803 Family history of malignant neoplasm of breast: Secondary | ICD-10-CM

## 2018-05-25 DIAGNOSIS — Z9189 Other specified personal risk factors, not elsewhere classified: Secondary | ICD-10-CM

## 2018-05-25 DIAGNOSIS — Z1239 Encounter for other screening for malignant neoplasm of breast: Secondary | ICD-10-CM

## 2018-05-25 LAB — HEMOCCULT GUIAC POC 1CARD (OFFICE): FECAL OCCULT BLD: NEGATIVE

## 2018-05-25 MED ORDER — MICROGESTIN FE 1/20 1-20 MG-MCG PO TABS: 1.0000 | ORAL_TABLET | Freq: Every day | ORAL | 3 refills | Status: DC

## 2018-05-25 NOTE — Patient Instructions (Signed)
I value your feedback and entrusting us with your care. If you get a [facility] patient survey, I would appreciate you taking the time to let us know about your experience today. Thank you!  [facility]: [phone]    

## 2018-06-25 ENCOUNTER — Other Ambulatory Visit: Payer: Self-pay | Admitting: Nurse Practitioner

## 2018-06-25 ENCOUNTER — Encounter: Payer: Self-pay | Admitting: Nurse Practitioner

## 2018-06-25 DIAGNOSIS — E7800 Pure hypercholesterolemia, unspecified: Secondary | ICD-10-CM

## 2018-06-25 NOTE — Telephone Encounter (Signed)
Mychart message

## 2018-06-29 ENCOUNTER — Other Ambulatory Visit: Payer: BC Managed Care – PPO

## 2018-06-30 LAB — COMPLETE METABOLIC PANEL WITH GFR
AG Ratio: 1.5 (calc) (ref 1.0–2.5)
ALT: 12 U/L (ref 6–29)
AST: 15 U/L (ref 10–35)
Albumin: 4 g/dL (ref 3.6–5.1)
Alkaline phosphatase (APISO): 53 U/L (ref 37–153)
BUN: 7 mg/dL (ref 7–25)
CO2: 25 mmol/L (ref 20–32)
Calcium: 8.9 mg/dL (ref 8.6–10.4)
Chloride: 106 mmol/L (ref 98–110)
Creat: 0.93 mg/dL (ref 0.50–1.05)
GFR, Est African American: 83 mL/min/{1.73_m2} (ref >=60)
GFR, Est Non African American: 72 mL/min/{1.73_m2} (ref >=60)
Globulin: 2.6 g/dL (calc) (ref 1.9–3.7)
Glucose, Bld: 90 mg/dL (ref 65–99)
Potassium: 4 mmol/L (ref 3.5–5.3)
Sodium: 140 mmol/L (ref 135–146)
Total Bilirubin: 0.4 mg/dL (ref 0.2–1.2)
Total Protein: 6.6 g/dL (ref 6.1–8.1)

## 2018-06-30 LAB — LIPID PANEL
Cholesterol: 166 mg/dL (ref <200)
HDL: 52 mg/dL (ref >=50)
LDL Cholesterol (Calc): 92 mg/dL (calc)
Non-HDL Cholesterol (Calc): 114 mg/dL (calc) (ref <130)
Total CHOL/HDL Ratio: 3.2 (calc) (ref <5.0)
Triglycerides: 122 mg/dL (ref <150)

## 2018-07-03 ENCOUNTER — Other Ambulatory Visit: Payer: Self-pay

## 2018-07-03 ENCOUNTER — Encounter: Payer: Self-pay | Admitting: Nurse Practitioner

## 2018-07-03 ENCOUNTER — Ambulatory Visit: Payer: BC Managed Care – PPO | Admitting: Nurse Practitioner

## 2018-07-03 VITALS — BP 123/81 | HR 73 | Temp 98.1°F | Ht 62.0 in | Wt 142.8 lb

## 2018-07-03 DIAGNOSIS — M791 Myalgia, unspecified site: Secondary | ICD-10-CM

## 2018-07-03 DIAGNOSIS — Z789 Other specified health status: Secondary | ICD-10-CM | POA: Insufficient documentation

## 2018-07-03 DIAGNOSIS — J069 Acute upper respiratory infection, unspecified: Secondary | ICD-10-CM

## 2018-07-03 DIAGNOSIS — T466X5A Adverse effect of antihyperlipidemic and antiarteriosclerotic drugs, initial encounter: Secondary | ICD-10-CM

## 2018-07-03 DIAGNOSIS — E7800 Pure hypercholesterolemia, unspecified: Secondary | ICD-10-CM

## 2018-07-03 DIAGNOSIS — G43839 Menstrual migraine, intractable, without status migrainosus: Secondary | ICD-10-CM | POA: Diagnosis not present

## 2018-07-03 MED ORDER — SIMVASTATIN 10 MG PO TABS: ORAL_TABLET | ORAL | 3 refills | Status: DC

## 2018-07-03 MED ORDER — PROPRANOLOL HCL ER 60 MG PO CP24
60.0000 mg | ORAL_CAPSULE | Freq: Every day | ORAL | 5 refills | Status: DC
Start: 2018-07-03 — End: 2018-07-28

## 2018-07-03 NOTE — Progress Notes (Signed)
Subjective:    Patient ID: Jamie Stanley, female    DOB: [birth year], 48 y.o.   MRN: [id number]  Jamie Stanley is a 48 y.o. female presenting on 07/03/2018 for Hyperlipidemia (frequent persistent headaches pre& post  menstrual cycle.); laboratory result; and Cough (coughing, nasal drainage, head congestion, and ear discomfort x 5 days. She notice some improvement with her symptoms. )   HPI Hyperlipidemia Simvastatin 10 mg one tab 2 times per week and stops for about 1-2 weeks.  Changes to red yeast rice during break from simvastatin. - Takes breaks about 1 week per month. - Pt denies changes in vision, chest tightness/pressure, palpitations, shortness of breath, leg pain while walking, leg or arm weakness, and sudden loss of speech or loss of consciousness.   Cough Patient notes mild cough, with improvement - likely viral.  Patient is not concerned about symptoms today and is not requesting any new treatment.  Headache  Gets bad headache pre and post menses.  Tylenol is hleping some.  Ibuprofen helps more takes 400-600 mg with headache.   - Usually has improvement with 600 mg ibuprofen doses.  Needs this daily for 2 weeks and is concerned.  - Has light sensitivity, occasionally interrupts sleep (2nd shift),  - Occasional nausea but no vomiting. - Is having increased financial burden.  Social History   Tobacco Use  . Smoking status: Never Smoker  . Smokeless tobacco: Never Used  Substance Use Topics  . Alcohol use: No  . Drug use: No    Review of Systems Per HPI unless specifically indicated above     Objective:    BP 123/81 (BP Location: Right Arm, Patient Position: Sitting, Cuff Size: Normal)   Pulse 73   Temp 98.1 F (36.7 C) (Oral)   Ht 5\' 2"  (1.575 m)   Wt 142 lb 12.8 oz (64.8 kg)   BMI 26.12 kg/m   Wt Readings from Last 3 Encounters:  07/03/18 142 lb 12.8 oz (64.8 kg)  05/25/18 146 lb (66.2 kg)  02/03/18 141 lb 3.2 oz (64 kg)    Physical Exam Vitals signs reviewed.    Constitutional:      General: She is awake. She is not in acute distress.    Appearance: She is well-developed.  HENT:     Head: Normocephalic and atraumatic.  Neck:     Musculoskeletal: Normal range of motion and neck supple.     Vascular: No carotid bruit.  Cardiovascular:     Rate and Rhythm: Normal rate and regular rhythm.     Pulses:          Radial pulses are 2+ on the right side and 2+ on the left side.       Posterior tibial pulses are 1+ on the right side and 1+ on the left side.     Heart sounds: Normal heart sounds, S1 normal and S2 normal.  Pulmonary:     Effort: Pulmonary effort is normal. No respiratory distress.     Breath sounds: Normal breath sounds and air entry.  Abdominal:     General: Bowel sounds are normal. There is no distension.     Palpations: Abdomen is soft. There is no hepatomegaly or splenomegaly.     Tenderness: There is no abdominal tenderness.     Hernia: No hernia is present.  Skin:    General: Skin is warm and dry.  Neurological:     Mental Status: She is alert and oriented to person, place, and time.  Psychiatric:        Attention and Perception: Attention normal.        Mood and Affect: Mood and affect normal.        Behavior: Behavior normal. Behavior is cooperative.        Thought Content: Thought content normal.        Judgment: Judgment normal.    Results for orders placed or performed in visit on 06/29/18  COMPLETE METABOLIC PANEL WITH GFR  Result Value Ref Range   Glucose, Bld 90 65 - 99 mg/dL   BUN 7 7 - 25 mg/dL   Creat 0.93 0.50 - 1.05 mg/dL   GFR, Est Non African American 72 > OR = 60 mL/min/1.82m2   GFR, Est African American 83 > OR = 60 mL/min/1.25m2   BUN/Creatinine Ratio NOT APPLICABLE 6 - 22 (calc)   Sodium 140 135 - 146 mmol/L   Potassium 4.0 3.5 - 5.3 mmol/L   Chloride 106 98 - 110 mmol/L   CO2 25 20 - 32 mmol/L   Calcium 8.9 8.6 - 10.4 mg/dL   Total Protein 6.6 6.1 - 8.1 g/dL   Albumin 4.0 3.6 - 5.1 g/dL    Globulin 2.6 1.9 - 3.7 g/dL (calc)   AG Ratio 1.5 1.0 - 2.5 (calc)   Total Bilirubin 0.4 0.2 - 1.2 mg/dL   Alkaline phosphatase (APISO) 53 37 - 153 U/L   AST 15 10 - 35 U/L   ALT 12 6 - 29 U/L  Lipid panel  Result Value Ref Range   Cholesterol 166 <200 mg/dL   HDL 52 > OR = 50 mg/dL   Triglycerides 122 <150 mg/dL   LDL Cholesterol (Calc) 92 mg/dL (calc)   Total CHOL/HDL Ratio 3.2 <5.0 (calc)   Non-HDL Cholesterol (Calc) 114 <130 mg/dL (calc)      Assessment & Plan:   Problem List Items Addressed This Visit      Cardiovascular and Mediastinum   Intractable menstrual migraine without status migrainosus Patient with regular migraine, now intractable but without status.  Patient has nearly daily migraine and takes OTC analgesics regularly.  Requests assistance.  Possibly related to menses as these weeks are cyclic.  Patient has at least 14 headache days per month.  Plan: 1. START propranolol 60 mg once daily for prophylaxis. 2. Continue OTC analgesics prn. 3. Keep headache diary. 4. Consider future CGRP-ra if not helpful. 5. Follow-up 3-6 months    Relevant Medications   simvastatin (ZOCOR) 10 MG tablet   propranolol ER (INDERAL LA) 60 MG 24 hr capsule     Other   Hyperlipidemia - Primary Controlled hyperlipidemia.  Currently taking simvastatin 10 mg twice weekly. No new ASCVD events since last visit.   - Family history of ASCVD events/atherosclerosis.  Plan: 1. Continue taking rosuvastatin 10 mg twice weekly with one week off as needed for arthralgia. - Reviewed common side effects of myalgia (reversible off med), less common side effects of cognitive impairment (reversible off med), increased glucose, rhabdomyolysis. 2. Discussed low glycemic diet. - handout provided 3. Discussed increasing exercise to 30 minutes most days of the week. 4. Follow-up with labs in about 6 months.    Relevant Medications   simvastatin (ZOCOR) 10 MG tablet   propranolol ER (INDERAL LA) 60 MG  24 hr capsule   Other Relevant Orders   COMPLETE METABOLIC PANEL WITH GFR   Lipid panel   TSH   Myalgia due to statin   Relevant Medications   simvastatin (  ZOCOR) 10 MG tablet   Statin intolerance   Relevant Medications   simvastatin (ZOCOR) 10 MG tablet    Other Visit Diagnoses    Viral URI     Stable.  Self-limited patient not needing any additional medical advice regarding these symptoms today.      Meds ordered this encounter  Medications  . simvastatin (ZOCOR) 10 MG tablet    Sig: Take 1 tablet by mouth 2 days per week.    Dispense:  28 tablet    Refill:  3    Order Specific Question:   Supervising Provider    Answer:   Olin Hauser [[id number]]  . propranolol ER (INDERAL LA) 60 MG 24 hr capsule    Sig: Take 1 capsule (60 mg total) by mouth daily.    Dispense:  30 capsule    Refill:  5    Order Specific Question:   Supervising Provider    Answer:   Olin Hauser [[id number]]    Follow up plan: Return in about 6 months (around 01/03/2019) for cholesterol AND sooner for headache if needed.[name], DNP, AGPCNP-BC Adult Gerontology Primary Care Nurse Practitioner [facility] 07/03/2018, 8:47 AM

## 2018-07-03 NOTE — Patient Instructions (Addendum)
Jamie Stanley,   Thank you for coming in to clinic today.  1. Likely having menstrual migraines - START propranolol XL 60 mg once daily.  Can increase if headache days reduce, but are still bothersome.  Send message to clinic.  2. Continue simvastatin 10 mg twice weekly and 1 week off per month.   Please schedule a follow-up appointment with [name], AGNP. Return in about 6 months (around 01/03/2019) for cholesterol AND sooner for headache if needed..  If you have any other questions or concerns, please feel free to call the clinic or send a message through [place]. You may also schedule an earlier appointment if necessary.  You will receive a survey after today's visit either digitally by e-mail or paper by [employer]. Your experiences and feedback matter to [place].  Please respond so we know how we are doing as we provide care for you.   [name], DNP, AGNP-BC Adult Gerontology Nurse Practitioner [place]

## 2018-07-06 ENCOUNTER — Encounter: Payer: Self-pay | Admitting: Nurse Practitioner

## 2018-07-26 ENCOUNTER — Encounter: Payer: Self-pay | Admitting: Nurse Practitioner

## 2018-07-26 DIAGNOSIS — G43839 Menstrual migraine, intractable, without status migrainosus: Secondary | ICD-10-CM

## 2018-07-28 MED ORDER — PROPRANOLOL HCL 10 MG PO TABS: ORAL_TABLET | ORAL | 0 refills | Status: DC

## 2018-07-28 NOTE — Addendum Note (Signed)
Addended by: Olin Hauser on: 07/28/2018 08:17 AM   Modules accepted: Orders

## 2018-08-19 ENCOUNTER — Other Ambulatory Visit: Payer: Self-pay | Admitting: Family Medicine

## 2018-08-19 DIAGNOSIS — G43839 Menstrual migraine, intractable, without status migrainosus: Secondary | ICD-10-CM

## 2018-08-20 ENCOUNTER — Other Ambulatory Visit: Payer: Self-pay | Admitting: Nurse Practitioner

## 2018-08-20 DIAGNOSIS — M791 Myalgia, unspecified site: Secondary | ICD-10-CM

## 2018-08-20 DIAGNOSIS — E7800 Pure hypercholesterolemia, unspecified: Secondary | ICD-10-CM

## 2018-08-20 DIAGNOSIS — T466X5A Adverse effect of antihyperlipidemic and antiarteriosclerotic drugs, initial encounter: Secondary | ICD-10-CM

## 2018-08-20 DIAGNOSIS — Z789 Other specified health status: Secondary | ICD-10-CM

## 2018-10-19 ENCOUNTER — Other Ambulatory Visit: Payer: Self-pay

## 2018-10-19 ENCOUNTER — Ambulatory Visit (INDEPENDENT_AMBULATORY_CARE_PROVIDER_SITE_OTHER): Payer: BC Managed Care – PPO | Admitting: Family Medicine

## 2018-10-19 ENCOUNTER — Encounter: Payer: Self-pay | Admitting: Family Medicine

## 2018-10-19 DIAGNOSIS — Z20828 Contact with and (suspected) exposure to other viral communicable diseases: Secondary | ICD-10-CM

## 2018-10-19 DIAGNOSIS — Z20822 Contact with and (suspected) exposure to covid-19: Secondary | ICD-10-CM

## 2018-10-19 NOTE — Patient Instructions (Addendum)
Please proceed with COVID19 testing as advised through [facility].  I recommend that you remain out of work until your test has resulted - if negative can return to work, if positive, need to be out for 10 days after positive test.  Return or follow-up seek care if significant worsening or new symptoms.  Contact us if you need [place] to order test through [facility].  Please schedule a Follow-up Appointment to: Return in about 1 week (around 10/26/2018), or if symptoms worsen or fail to improve, for exposure covid.  If you have any other questions or concerns, please feel free to call the office or send a message through [place]. You may also schedule an earlier appointment if necessary.  Additionally, you may be receiving a survey about your experience at our office within a few days to 1 week by e-mail or mail. We value your feedback.  Nobie Putnam, DO [place]

## 2018-10-19 NOTE — Progress Notes (Signed)
Virtual Visit via Telephone The purpose of this virtual visit is to provide medical care while limiting exposure to the novel coronavirus (COVID19) for both patient and office staff.  Consent was obtained for phone visit:  Yes.   Answered questions that patient had about telehealth interaction:  Yes.   I discussed the limitations, risks, security and privacy concerns of performing an evaluation and management service by telephone. I also discussed with the patient that there may be a patient responsible charge related to this service. The patient expressed understanding and agreed to proceed.  Patient Location: Home Provider Location: [facility] (Office)  PCP is [name], AGPCNP-BC - I am currently covering during her maternity leave.    ---------------------------------------------------------------------- Chief Complaint  Patient presents with  . covid exposure    patient's spouse tested for covid and tested positive went to [facility] she is asking for testing    S: Reviewed CMA documentation. I have called patient and gathered additional HPI as follows:  EXPOSURE TO COVID19 Recent course, Husband tested positive for [place], she has been exposed to him. She feels fine and is asymptomatic. She said that her employer was not requiring a test it was up to her provider. She has to go to [facility] to pickup her husbands test result for his employer. She is interested to get tested there.  Denies any fevers, chills, sweats, body ache, cough, shortness of breath, sinus pain or pressure, headache, abdominal pain, diarrhea  -------------------------------------------------------------------------- O: No physical exam performed due to remote telephone encounter.  -------------------------------------------------------------------------- A&P:   EXPOSURE TO COVID19  - Reassuring without high risk symptoms - Afebrile, without dyspnea - No comorbid pulmonary conditions  (asthma, COPD) or immunocompromise  Cannot rule out COVID19 based on history, can be asymptomatic carrier with known exposure close contact.  Recommended proceed with COVID19 testing - offered to order this through our [place] system through [facility], however patient declines and she will get tested at [facility] today If needed - symptomatic medications as recommended for her symptoms, prefer Tylenol for HA instead of NSAID, decongestant, mucinex, hydration Did not write a note for her but - advised that she needs to remain out of work once tested and can return once negative, or if positive will need to be out for up to 10 days after positive result, and with resolving symptoms fever free >3 days  No orders of the defined types were placed in this encounter.   REQUIRED self quarantine to [place] - advised to avoid all exposure with others while during TESTING - waiting on test result  If symptoms do not resolve or significantly improve OR if WORSENING - fever / cough - or worsening shortness of breath - then should contact us and seek advice on next steps in treatment at home vs where/when to seek care at Urgent Care or Hospital ED for further intervention and possible testing if indicated.  Patient verbalizes understanding with the above medical recommendations including the limitation of remote medical advice.  Specific follow-up / call-back criteria were given for patient to follow-up or seek medical care more urgently if needed.   - Time spent in direct consultation with patient on phone: 8 minutes   Nobie Putnam, [facility] 10/19/2018, 3:33 PM

## 2018-10-21 ENCOUNTER — Telehealth: Payer: Self-pay | Admitting: General Practice

## 2018-10-21 ENCOUNTER — Other Ambulatory Visit: Payer: Self-pay

## 2018-10-21 ENCOUNTER — Telehealth: Payer: Self-pay

## 2018-10-21 DIAGNOSIS — Z20822 Contact with and (suspected) exposure to covid-19: Secondary | ICD-10-CM

## 2018-10-21 NOTE — Telephone Encounter (Signed)
Can you clarify with patient?  We had virtual visit on 6/22 and she declined COVID19 testing through [facility] - she said she was on her way to [facility] to get tested.  I presume that she did not get tested at an Urgent Care?  If she wants we can send order to [facility] to proceed.  Nobie Putnam, [facility] 10/21/2018, 9:13 AM

## 2018-10-21 NOTE — Telephone Encounter (Signed)
Patient called back today and would like to be COVID tested.  Her husband is positive and she has sinus symptoms now.

## 2018-10-21 NOTE — Telephone Encounter (Signed)
Pt has been scheduled for covid testing.  Pt was referred by: Olin Hauser, DO  Scheduled apt with pt directly at [facility].

## 2018-10-21 NOTE — Telephone Encounter (Signed)
She wants testing done through [facility] possible at [facility] location.

## 2018-10-21 NOTE — Addendum Note (Signed)
Addended by: Denman George on: 10/21/2018 12:36 PM   Modules accepted: Orders

## 2018-10-21 NOTE — Telephone Encounter (Signed)
Pt has been scheduled for covid testing.  ° °

## 2018-10-21 NOTE — Telephone Encounter (Signed)
Sent community message to [facility] 10/21/2018, 10:20 AM

## 2018-10-25 LAB — NOVEL CORONAVIRUS, NAA: SARS-CoV-2, NAA: NOT DETECTED

## 2018-10-29 ENCOUNTER — Other Ambulatory Visit: Payer: Self-pay | Admitting: Nurse Practitioner

## 2018-10-29 DIAGNOSIS — G43839 Menstrual migraine, intractable, without status migrainosus: Secondary | ICD-10-CM

## 2018-10-29 MED ORDER — PROPRANOLOL HCL 10 MG PO TABS: ORAL_TABLET | ORAL | 1 refills | Status: DC

## 2018-10-30 ENCOUNTER — Encounter: Payer: Self-pay | Admitting: Nurse Practitioner

## 2018-11-24 ENCOUNTER — Other Ambulatory Visit: Payer: Self-pay | Admitting: Family Medicine

## 2018-11-24 DIAGNOSIS — G43839 Menstrual migraine, intractable, without status migrainosus: Secondary | ICD-10-CM

## 2018-12-25 ENCOUNTER — Other Ambulatory Visit: Payer: Self-pay | Admitting: Nurse Practitioner

## 2018-12-25 DIAGNOSIS — G43839 Menstrual migraine, intractable, without status migrainosus: Secondary | ICD-10-CM

## 2019-01-06 ENCOUNTER — Encounter: Payer: Self-pay | Admitting: Nurse Practitioner

## 2019-01-06 ENCOUNTER — Other Ambulatory Visit: Payer: Self-pay

## 2019-01-06 ENCOUNTER — Ambulatory Visit: Payer: BC Managed Care – PPO | Admitting: Nurse Practitioner

## 2019-01-06 VITALS — BP 100/65 | HR 66 | Ht 62.0 in | Wt 141.6 lb

## 2019-01-06 DIAGNOSIS — E7800 Pure hypercholesterolemia, unspecified: Secondary | ICD-10-CM | POA: Diagnosis not present

## 2019-01-06 DIAGNOSIS — G43839 Menstrual migraine, intractable, without status migrainosus: Secondary | ICD-10-CM

## 2019-01-06 DIAGNOSIS — Z789 Other specified health status: Secondary | ICD-10-CM | POA: Diagnosis not present

## 2019-01-06 DIAGNOSIS — Z23 Encounter for immunization: Secondary | ICD-10-CM | POA: Diagnosis not present

## 2019-01-06 DIAGNOSIS — T466X5A Adverse effect of antihyperlipidemic and antiarteriosclerotic drugs, initial encounter: Secondary | ICD-10-CM

## 2019-01-06 DIAGNOSIS — M791 Myalgia, unspecified site: Secondary | ICD-10-CM

## 2019-01-06 MED ORDER — SIMVASTATIN 10 MG PO TABS: ORAL_TABLET | ORAL | 3 refills | Status: DC

## 2019-01-06 MED ORDER — PROPRANOLOL HCL 10 MG PO TABS: 10.0000 mg | ORAL_TABLET | Freq: Every day | ORAL | 1 refills | Status: DC

## 2019-01-06 NOTE — Progress Notes (Signed)
Subjective:    Patient ID: Jamie Stanley, female    DOB: 23-Nov-1970, 48 y.o.   MRN: [id number]  Jamie Stanley is a 48 y.o. female presenting on 01/06/2019 for Hyperlipidemia   HPI Hyperlipidemia Patient continues to take simvastatin 10 mg tablet 3 days per week most weeks.  Patient stops taking for about 1 week when joint pains return.  This allows joint pains to resolve.  Patient is not taking her red yeast rice as consistently as in past. - Pt denies changes in vision, chest tightness/pressure, palpitations, shortness of breath, leg pain while walking, leg or arm weakness, and sudden loss of speech or loss of consciousness.  - Continues to eat heart healthy diet. - Does not get regular exercise, but generally stays active.  Migraine Propranolol is working very well for migraines.  Takes only once daily.  If misses, she doesn't feel as well.  No migraines in last 3 months, even around time of period.  Previously had migraines every month for several days, worse at menses.  Social History   Tobacco Use  . Smoking status: Never Smoker  . Smokeless tobacco: Never Used  Substance Use Topics  . Alcohol use: Yes    Comment: occasional  . Drug use: Yes    Review of Systems Per HPI unless specifically indicated above     Objective:    BP 100/65 (BP Location: Right Arm, Patient Position: Sitting, Cuff Size: Normal)   Pulse 66   Ht 5\' 2"  (1.575 m)   Wt 141 lb 9.6 oz (64.2 kg)   LMP 12/31/2018   BMI 25.90 kg/m   Wt Readings from Last 3 Encounters:  01/06/19 141 lb 9.6 oz (64.2 kg)  07/03/18 142 lb 12.8 oz (64.8 kg)  05/25/18 146 lb (66.2 kg)    Physical Exam Vitals signs reviewed.  Constitutional:      General: She is awake. She is not in acute distress.    Appearance: She is well-developed.  HENT:     Head: Normocephalic and atraumatic.  Neck:     Musculoskeletal: Normal range of motion and neck supple.     Vascular: No carotid bruit.  Cardiovascular:     Rate and Rhythm:  Normal rate and regular rhythm.     Pulses:          Radial pulses are 2+ on the right side and 2+ on the left side.       Posterior tibial pulses are 2+ on the right side and 2+ on the left side.     Heart sounds: Normal heart sounds, S1 normal and S2 normal.     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 No respiratory distress.     Breath sounds: Normal breath sounds and air entry.  Skin:    General: Skin is warm and dry.  Neurological:     Mental Status: She is alert and oriented to person, place, and time.  Psychiatric:        Attention and Perception: Attention normal.        Mood and Affect: Mood and affect normal.        Behavior: Behavior normal. Behavior is cooperative.      Results for orders placed or performed in visit on 01/06/19  Lipid panel  Result Value Ref Range   Cholesterol 232 (H) <200 mg/dL   HDL 51 > OR = 50 mg/dL  Triglycerides 185 (H) <150 mg/dL   LDL Cholesterol (Calc) 149 (H) mg/dL (calc)   Total CHOL/HDL Ratio 4.5 <5.0 (calc)   Non-HDL Cholesterol (Calc) 181 (H) <130 mg/dL (calc)  COMPLETE METABOLIC PANEL WITH GFR  Result Value Ref Range   Glucose, Bld 90 65 - 99 mg/dL   BUN 12 7 - 25 mg/dL   Creat 0.96 0.50 - 1.05 mg/dL   GFR, Est Non African American 69 > OR = 60 mL/min/1.75m2   GFR, Est African American 80 > OR = 60 mL/min/1.87m2   BUN/Creatinine Ratio NOT APPLICABLE 6 - 22 (calc)   Sodium 139 135 - 146 mmol/L   Potassium 4.4 3.5 - 5.3 mmol/L   Chloride 103 98 - 110 mmol/L   CO2 26 20 - 32 mmol/L   Calcium 9.5 8.6 - 10.4 mg/dL   Total Protein 7.0 6.1 - 8.1 g/dL   Albumin 4.1 3.6 - 5.1 g/dL   Globulin 2.9 1.9 - 3.7 g/dL (calc)   AG Ratio 1.4 1.0 - 2.5 (calc)   Total Bilirubin 0.5 0.2 - 1.2 mg/dL   Alkaline phosphatase (APISO) 69 37 - 153 U/L   AST 14 10 - 35 U/L   ALT 12 6 - 29 U/L      Assessment & Plan:   Problem List Items Addressed This  Visit      Cardiovascular and Mediastinum   Intractable menstrual migraine without status migrainosus Stable today on exam.  Medications tolerated without side effects.  Continue at current doses.  Refills provided.   . Followup 6 months.    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  Patient has significant family history for ASCVD.   - Patient has had no ascvd events or symptoms intervally - Statin intolerance leads [place] to use maximally tolerated statin dose.  Plan: 1. Continue simvastatin 10 mg three days per week - take 1 week break when needed 2. May need to add ezetimibe in future.  Also consider future use of Repatha vs Praluent. 3. Encouraged heart healthy diet, regular physicial activity. 4. Labs today 5. Follow-up 6 months   Relevant Medications   propranolol (INDERAL) 10 MG tablet   simvastatin (ZOCOR) 10 MG tablet   Other Relevant Orders   Lipid panel (Completed)   COMPLETE METABOLIC PANEL WITH GFR (Completed)   Myalgia due to statin   Relevant Medications   simvastatin (ZOCOR) 10 MG tablet   Statin intolerance   Relevant Medications   simvastatin (ZOCOR) 10 MG tablet    Other Visit Diagnoses    Flu vaccine need       Relevant Orders   Flu Vaccine QUAD 6+ mos PF IM (Fluarix Quad PF) (Completed)      Meds ordered this encounter  Medications  . propranolol (INDERAL) 10 MG tablet    Sig: Take 1 tablet (10 mg total) by mouth daily.    Dispense:  90 tablet    Refill:  1    Order Specific Question:   Supervising Provider    Answer:   Olin Hauser [[id number]]  . simvastatin (ZOCOR) 10 MG tablet    Sig: TAKE 1 TABLET BY MOUTH 3 DAYS PER WEEK.    Dispense:  36 tablet    Refill:  3    90 day supply    Order  Specific Question:   Supervising Provider    Answer:    Olin Hauser [[id number]]    Follow up plan: Return in about 6 months (around 07/06/2019) for cholesterol.  [name], DNP, AGPCNP-BC Adult Gerontology Primary Care Nurse Practitioner [facility] 01/06/2019, 11:35 AM

## 2019-01-06 NOTE — Patient Instructions (Addendum)
Jamie Stanley,   Thank you for coming in to clinic today.  1. No changes to medications today.    Consider Repatha or Praluent if LDL still well above normal range.   Please schedule a follow-up appointment with [name], AGNP. Return in about 6 months (around 07/06/2019) for cholesterol.  If you have any other questions or concerns, please feel free to call the clinic or send a message through [place]. You may also schedule an earlier appointment if necessary.  You will receive a survey after today's visit either digitally by e-mail or paper by [employer]. Your experiences and feedback matter to [place].  Please respond so we know how we are doing as we provide care for you.   [name], DNP, AGNP-BC Adult Gerontology Nurse Practitioner [place]

## 2019-01-07 LAB — COMPLETE METABOLIC PANEL WITH GFR
AG Ratio: 1.4 (calc) (ref 1.0–2.5)
ALT: 12 U/L (ref 6–29)
AST: 14 U/L (ref 10–35)
Albumin: 4.1 g/dL (ref 3.6–5.1)
Alkaline phosphatase (APISO): 69 U/L (ref 37–153)
BUN: 12 mg/dL (ref 7–25)
CO2: 26 mmol/L (ref 20–32)
Calcium: 9.5 mg/dL (ref 8.6–10.4)
Chloride: 103 mmol/L (ref 98–110)
Creat: 0.96 mg/dL (ref 0.50–1.05)
GFR, Est African American: 80 mL/min/{1.73_m2} (ref >=60)
GFR, Est Non African American: 69 mL/min/{1.73_m2} (ref >=60)
Globulin: 2.9 g/dL (calc) (ref 1.9–3.7)
Glucose, Bld: 90 mg/dL (ref 65–99)
Potassium: 4.4 mmol/L (ref 3.5–5.3)
Sodium: 139 mmol/L (ref 135–146)
Total Bilirubin: 0.5 mg/dL (ref 0.2–1.2)
Total Protein: 7 g/dL (ref 6.1–8.1)

## 2019-01-07 LAB — LIPID PANEL
Cholesterol: 232 mg/dL — ABNORMAL HIGH (ref <200)
HDL: 51 mg/dL (ref >=50)
LDL Cholesterol (Calc): 149 mg/dL (calc) — ABNORMAL HIGH
Non-HDL Cholesterol (Calc): 181 mg/dL (calc) — ABNORMAL HIGH (ref <130)
Total CHOL/HDL Ratio: 4.5 (calc) (ref <5.0)
Triglycerides: 185 mg/dL — ABNORMAL HIGH (ref <150)

## 2019-01-22 ENCOUNTER — Other Ambulatory Visit: Payer: Self-pay | Admitting: Nurse Practitioner

## 2019-01-22 DIAGNOSIS — G43839 Menstrual migraine, intractable, without status migrainosus: Secondary | ICD-10-CM

## 2019-03-09 ENCOUNTER — Other Ambulatory Visit: Payer: Self-pay | Admitting: Family Medicine

## 2019-03-09 DIAGNOSIS — G43839 Menstrual migraine, intractable, without status migrainosus: Secondary | ICD-10-CM

## 2019-04-30 DIAGNOSIS — Z1211 Encounter for screening for malignant neoplasm of colon: Secondary | ICD-10-CM

## 2019-04-30 HISTORY — DX: Encounter for screening for malignant neoplasm of colon: Z12.11

## 2019-05-23 ENCOUNTER — Other Ambulatory Visit: Payer: Self-pay | Admitting: Obstetrics and Gynecology

## 2019-05-23 DIAGNOSIS — Z3041 Encounter for surveillance of contraceptive pills: Secondary | ICD-10-CM

## 2019-06-10 ENCOUNTER — Other Ambulatory Visit: Payer: Self-pay | Admitting: Family Medicine

## 2019-06-10 DIAGNOSIS — G43839 Menstrual migraine, intractable, without status migrainosus: Secondary | ICD-10-CM

## 2019-07-02 ENCOUNTER — Encounter: Payer: Self-pay | Admitting: Family Medicine

## 2019-07-02 ENCOUNTER — Ambulatory Visit (INDEPENDENT_AMBULATORY_CARE_PROVIDER_SITE_OTHER): Payer: BC Managed Care – PPO | Admitting: Family Medicine

## 2019-07-02 ENCOUNTER — Other Ambulatory Visit: Payer: Self-pay

## 2019-07-02 VITALS — BP 119/68 | HR 69 | Temp 97.5°F | Ht 62.0 in | Wt 143.8 lb

## 2019-07-02 DIAGNOSIS — M2550 Pain in unspecified joint: Secondary | ICD-10-CM | POA: Diagnosis not present

## 2019-07-02 DIAGNOSIS — E7800 Pure hypercholesterolemia, unspecified: Secondary | ICD-10-CM

## 2019-07-02 DIAGNOSIS — Z0000 Encounter for general adult medical examination without abnormal findings: Secondary | ICD-10-CM | POA: Insufficient documentation

## 2019-07-02 DIAGNOSIS — Z91018 Allergy to other foods: Secondary | ICD-10-CM | POA: Diagnosis not present

## 2019-07-02 DIAGNOSIS — Z1211 Encounter for screening for malignant neoplasm of colon: Secondary | ICD-10-CM | POA: Diagnosis not present

## 2019-07-02 MED ORDER — EPINEPHRINE 0.3 MG/0.3ML IJ SOAJ: 0.3000 mg | INTRAMUSCULAR | 1 refills | Status: DC | PRN

## 2019-07-02 NOTE — Progress Notes (Addendum)
Subjective:    Patient ID: Jamie Stanley, female    DOB: [birth year], 49 y.o.   MRN: [id number]  Jamie Stanley is a 49 y.o. female presenting on 07/02/2019 for Joint Pain (severe bilateral joint pain hands, knees, and hip She also complains of left side sciatic pain. The pt contributes the symptoms to a reaction from the COVID vaccine. She also complains of fatigue and joint stiffiness. Her symptoms started one day after getting the vaccine.)   HPI   [name] presents to clinic for annual wellness and with concerns for severe joint pain, fatigue and joint stiffness 1 day after getting her COVID vaccine almost 2+ months ago.  Denies any history of autoimmune condition, arthritis, or family history.  HEALTH MAINTENANCE:  Weight/BMI: Has gained 2 lbs since last visit in September 2020 Physical activity: No set exercise routine Diet: Regular Seatbelt: 100% of the time Sunscreen: Not always, but tries to stay in the shade  Mammogram: 05/29/2017 DUE and will contact [place] office for follow up PAP: 05/14/2017 Negative - DUE 66/0630 Follows with Ardeth Perfect, PA for OBGYN  Colon cancer screening: Due and Cologuard ordered today Optometry: Regularly Dentistry: Regularly  IMMUNIZATIONS: Influenza: 01/06/2019 Tetanus: 01/21/2017 COVID: 04/2019 - one dose completed with reaction   Depression screen [facility] 2/9 10/19/2018 01/21/2017  Decreased Interest 0 0  Down, Depressed, Hopeless 0 0  PHQ - 2 Score 0 0  Altered sleeping - 1  Tired, decreased energy - 1  Change in appetite - 0  Feeling bad or failure about yourself  - 0  Trouble concentrating - 0  Moving slowly or fidgety/restless - 0  Suicidal thoughts - 0  PHQ-9 Score - 2  Difficult doing work/chores - Not difficult at all    Past Medical History:  Diagnosis Date  . Allergy   . Asthma   . BRCA negative 2016   MyRisk neg  . Family history of breast cancer   . Family history of ovarian cancer   . Frequent headaches   .  Hyperlipidemia   . Increased risk of breast cancer 2016   IBIS=33%  . Physiological ovarian cysts    Past Surgical History:  Procedure Laterality Date  . APPENDECTOMY    . BIOPSY BREAST     left   . BREAST BIOPSY Left 2012   benign  . INGUINAL HERNIA REPAIR     Social History   Socioeconomic History  . Marital status: Married    Spouse name: Not on file  . Number of children: Not on file  . Years of education: Not on file  . Highest education level: Not on file  Occupational History  . Not on file  Tobacco Use  . Smoking status: Never Smoker  . Smokeless tobacco: Never Used  Substance and Sexual Activity  . Alcohol use: Yes    Comment: occasional  . Drug use: Yes  . Sexual activity: Yes    Birth control/protection: Pill  Other Topics Concern  . Not on file  Social History Narrative  . Not on file   Social Determinants of Health   Financial Resource Strain:   . Difficulty of Paying Living Expenses: Not on file  Food Insecurity:   . Worried About Charity fundraiser in the Last Year: Not on file  . Ran Out of Food in the Last Year: Not on file  Transportation Needs:   . Lack of Transportation (Medical): Not on file  . Lack of Transportation (Non-Medical):  Not on file  Physical Activity:   . Days of Exercise per Week: Not on file  . Minutes of Exercise per Session: Not on file  Stress:   . Feeling of Stress : Not on file  Social Connections:   . Frequency of Communication with Friends and Family: Not on file  . Frequency of Social Gatherings with Friends and Family: Not on file  . Attends Religious Services: Not on file  . Active Member of Clubs or Organizations: Not on file  . Attends Archivist Meetings: Not on file  . Marital Status: Not on file  Intimate Partner Violence:   . Fear of Current or Ex-Partner: Not on file  . Emotionally Abused: Not on file  . Physically Abused: Not on file  . Sexually Abused: Not on file   Family History   Problem Relation Age of Onset  . Hypertension Mother   . Hyperlipidemia Mother   . Breast cancer Mother 55       ER/PR type BRCA Neg  . AAA (abdominal aortic aneurysm) Mother   . Hypothyroidism Mother   . Hypertension Father   . Hyperlipidemia Father   . Glaucoma Father   . Heart disease Paternal Grandfather 60  . Diabetes Paternal Grandfather   . Ovarian cancer Maternal Grandmother 80  . Hypothyroidism Maternal Grandmother   . Hypothyroidism Sister   . Hypothyroidism Paternal Grandmother   . Diabetes Paternal Grandmother   . Breast cancer Other 46  . Breast cancer Other 70   Current Outpatient Medications on File Prior to Visit  Medication Sig  . ipratropium (ATROVENT) 0.06 % nasal spray   . loratadine (CLARITIN) 10 MG tablet Take 10 mg by mouth daily.  . propranolol (INDERAL) 10 MG tablet TAKE 1 TAB (10MG) UP TO 2 TO 3 TIMES A DAY FOR MIGRAINE PREVENTION (Patient not taking: Reported on 07/02/2019)  . Red Yeast Rice Extract 300 MG CAPS Take 300 capsules by mouth daily as needed.   . simvastatin (ZOCOR) 10 MG tablet TAKE 1 TABLET BY MOUTH 3 DAYS PER WEEK.  Marland Kitchen acetaminophen (TYLENOL) 325 MG tablet Take 650 mg by mouth every 6 (six) hours as needed.  Marland Kitchen ibuprofen (ADVIL,MOTRIN) 800 MG tablet Take 400 mg by mouth every 8 (eight) hours as needed.   Marland Kitchen MICROGESTIN FE 1/20 1-20 MG-MCG tablet Take 1 tablet by mouth daily. (Patient not taking: Reported on 07/02/2019)  . [DISCONTINUED] propranolol (INDERAL) 10 MG tablet TAKE 1 TAB (10MG) UP TO 2 TO 3 TIMES A DAY FOR MIGRAINE PREVENTION   No current facility-administered medications on file prior to visit.    Per HPI unless specifically indicated above     Objective:    BP 119/68 (BP Location: Right Arm, Patient Position: Sitting, Cuff Size: Small)   Pulse 69   Temp (!) 97.5 F (36.4 C) (Oral)   Ht 5' 2"  (1.575 m)   Wt 143 lb 12.8 oz (65.2 kg)   BMI 26.30 kg/m   Wt Readings from Last 3 Encounters:  07/02/19 143 lb 12.8 oz (65.2 kg)   01/06/19 141 lb 9.6 oz (64.2 kg)  07/03/18 142 lb 12.8 oz (64.8 kg)    Physical Exam Vitals reviewed.  Constitutional:      General: She is not in acute distress.    Appearance: Normal appearance. She is well-groomed and overweight. She is not ill-appearing or toxic-appearing.  HENT:     Head: Normocephalic.     Right Ear: Tympanic membrane, ear canal  and external ear normal. There is no impacted cerumen.     Left Ear: Tympanic membrane, ear canal and external ear normal. There is no impacted cerumen.     Nose: Nose normal. No congestion or rhinorrhea.     Mouth/Throat:     Lips: Pink.     Mouth: Mucous membranes are moist.     Pharynx: Oropharynx is clear. Uvula midline. No oropharyngeal exudate or posterior oropharyngeal erythema.  Eyes:     General: Lids are normal. Vision grossly intact. No scleral icterus.       Right eye: No discharge.        Left eye: No discharge.     Extraocular Movements: Extraocular movements intact.     Conjunctiva/sclera: Conjunctivae normal.     Pupils: Pupils are equal, round, and reactive to light.  Neck:     Thyroid: No thyroid mass or thyromegaly.  Cardiovascular:     Rate and Rhythm: Normal rate and regular rhythm.     Pulses: Normal pulses.          Dorsalis pedis pulses are 2+ on the right side and 2+ on the left side.       Posterior tibial pulses are 2+ on the right side and 2+ on the left side.     Heart sounds: Normal heart sounds. No murmur. No friction rub. No gallop.   Pulmonary:     Effort: Pulmonary effort is normal. No respiratory distress.     Breath sounds: Normal breath sounds.  Abdominal:     General: Abdomen is flat. Bowel sounds are normal. There is no distension.     Palpations: Abdomen is soft. There is no hepatomegaly, splenomegaly or mass.     Tenderness: There is no abdominal tenderness. There is no guarding or rebound.     Hernia: No hernia is present.  Musculoskeletal:        General: Normal range of motion.      Cervical back: Normal range of motion and neck supple. No tenderness.     Right lower leg: No edema.     Left lower leg: No edema.     Comments: No tenderness on exam  Feet:     Right foot:     Skin integrity: Skin integrity normal.     Left foot:     Skin integrity: Skin integrity normal.  Lymphadenopathy:     Cervical: No cervical adenopathy.  Skin:    General: Skin is warm and dry.     Capillary Refill: Capillary refill takes less than 2 seconds.  Neurological:     General: No focal deficit present.     Mental Status: She is alert and oriented to person, place, and time.     Cranial Nerves: No cranial nerve deficit.     Sensory: No sensory deficit.     Motor: No weakness.     Coordination: Coordination normal.     Gait: Gait normal.     Deep Tendon Reflexes: Reflexes normal.  Psychiatric:        Attention and Perception: Attention and perception normal.        Mood and Affect: Mood and affect normal.        Speech: Speech normal.        Behavior: Behavior normal. Behavior is cooperative.        Thought Content: Thought content normal.        Cognition and Memory: Cognition and memory normal.  Judgment: Judgment normal.     Results for orders placed or performed in visit on 01/06/19  Lipid panel  Result Value Ref Range   Cholesterol 232 (H) <200 mg/dL   HDL 51 > OR = 50 mg/dL   Triglycerides 185 (H) <150 mg/dL   LDL Cholesterol (Calc) 149 (H) mg/dL (calc)   Total CHOL/HDL Ratio 4.5 <5.0 (calc)   Non-HDL Cholesterol (Calc) 181 (H) <130 mg/dL (calc)  COMPLETE METABOLIC PANEL WITH GFR  Result Value Ref Range   Glucose, Bld 90 65 - 99 mg/dL   BUN 12 7 - 25 mg/dL   Creat 0.96 0.50 - 1.05 mg/dL   GFR, Est Non African American 69 > OR = 60 mL/min/1.56m   GFR, Est African American 80 > OR = 60 mL/min/1.719m  BUN/Creatinine Ratio NOT APPLICABLE 6 - 22 (calc)   Sodium 139 135 - 146 mmol/L   Potassium 4.4 3.5 - 5.3 mmol/L   Chloride 103 98 - 110 mmol/L   CO2 26 20  - 32 mmol/L   Calcium 9.5 8.6 - 10.4 mg/dL   Total Protein 7.0 6.1 - 8.1 g/dL   Albumin 4.1 3.6 - 5.1 g/dL   Globulin 2.9 1.9 - 3.7 g/dL (calc)   AG Ratio 1.4 1.0 - 2.5 (calc)   Total Bilirubin 0.5 0.2 - 1.2 mg/dL   Alkaline phosphatase (APISO) 69 37 - 153 U/L   AST 14 10 - 35 U/L   ALT 12 6 - 29 U/L      Assessment & Plan:   Problem List Items Addressed This Visit      Other   Hyperlipidemia   Relevant Medications   EPINEPHrine 0.3 mg/0.3 mL IJ SOAJ injection   Other Relevant Orders   CBC with Differential   CMP14+EGFR   Lipid Profile   Hx of food anaphylaxis - Primary    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  Is aware of food allergies and makes good choices when home and eating meals not prepared at home.  Plan: 1) Refill EPI-PEN today 2) Carry 2 EPI-PENs with you at all times 3) USE the EPI-PEN at the first sign of anaphylaxis and get to the Emergency Room IMMEDIATELY!      Relevant Medications   EPINEPHrine 0.3 mg/0.3 mL IJ SOAJ injection   Routine medical exam    Annual physical exam with no new findings.  Well adult with concerns for generalized severe joint pain with fatigue and joint stiffness.  Plan: 1. Obtain health maintenance screenings as above according to age. - Increase physical activity to 30 minutes most days of the week.  - Eat healthy diet high in vegetables and fruits; low in refined carbohydrates. - Screening labs and tests as ordered 2. Return 1 year for annual physical. 3. Labs for joint pain ordered today and will notify once results are received 4. Follow up for acute joint pain will be determined once lab results are received       Relevant Orders   CBC with Differential   CMP14+EGFR    Other Visit Diagnoses    Arthralgia, unspecified joint       Relevant Orders   CBC with Differential   CMP14+EGFR   Thyroid Panel With TSH   Rheumatoid Factor   ANA Direct w/Reflex if Positive   Sed Rate (ESR)   C-reactive protein   Anaplasma  Phagocytophila IgG/IgM Ab   Ehrlichia Antibody Panel   B. burgdorfi antibodies   Babesia microti Antibody Panel  Babesia microti Antibodies IgG, IgM   Screening for malignant neoplasm of colon       Relevant Orders   Cologuard      Meds ordered this encounter  Medications  . EPINEPHrine 0.3 mg/0.3 mL IJ SOAJ injection    Sig: Inject 0.3 mLs (0.3 mg total) into the muscle as needed for anaphylaxis.    Dispense:  2 each    Refill:  1      Follow up plan: Return in about 1 year (around 07/01/2020) for Wellness PE.  Will contact once lab results are received for follow up for acute concerns today.  Harlin Rain, FNP-C Family Nurse Practitioner [facility] 07/02/2019, 3:26 PM

## 2019-07-02 NOTE — Patient Instructions (Signed)
We have sent some of your labs to the lab for processing today.  Your tick bourne vector lab orders are on file for when you return for lab draw after checking with your insurance company.  Having high HDL (good) cholesterol and low triglyceride levels can reduce your risk of heart attack and stroke.  The following steps can be taken to reduce triglyceride levels and increase HDL (good) cholesterol levels.  Avoid or limit alcohol Alcohol significantly raises triglyceride levels.  If your triglycerides are higher than 150, avoid or limit alcohol.  Limit fruit juice and dried fruits Even fresh fruit juice contains a large amount of fructose sugar and can increase triglyceride and blood sugar levels.  Fruit juice and dried fruit are also high in calories and can add unwanted pounds.  Replace fruit juice and dried fruits with fresh fruit that has more fiber and fewer calories.  Limit non-diet soft drinks and sports drinks Non-diet soft drinks and sport drinks contain as many as 12 teaspoons of sugar per serving.  This sugar can increase triglyceride and blood sugar levels.  Non-diet soft drinks and sport drinks also contain calories that add unwanted pounds.  Replace non-diet soft drinks and sports drinks with water, unsweetened tea, diet colas or beverages sweetened with Nutra-Sweet.  Limit concentrated sweets Even fat free or low fat sweets can raise your triglyceride and/or blood sugar levels.  Concentrated sweets include sugar, honey, jelly, candy, cookies, cakes, pies, pastries, frozen desserts, puddings, and sugar sweetened cereals.  Replace concentrated sweets with high fiber foods such as fruit, low fat yogurt, sugar free gelatin and low fat puddings.  Limit refined carbohydrates Refined carbohydrates include white flour, white bread, white rice, and some pasta.  Limit portion sizes of these foods or replace them with whole wheat bread, lentils, whole grains, brown rice, and spinach or  whole-wheat pastas.  Reduce your weight, if needed Even a 5-10 pound weight loss can cause your triglyceride level to decrease significantly.  You can lose 1-2 pounds per week by limiting your portion sizes and exercising 5-6 times per week.  Include monounsaturated fats in your diet.  Include Monounsaturated fats in your diet Moderate amounts of monounsaturated fat can raise your HDL (good) cholesterol.  Sources of monounsaturated fat include olive oil, canola oil, peanut oil, peanuts, peanut butter, cashews, olives, and avocados.  Choose peanut butter that does not have sugar in the ingredient list.  Since these foods are high in calories, serving sizes should be moderate.  Include fish in your diet Fish is low in saturated fat and rich in Omega-3 fatty acids.  Good choices include mackerel, salmon, herring, bluefish, lake trout, tuna, and sardines canned in oil.  If you smoke, quit Smoking lowers HDL (good) cholesterol and raises triglycerides.  When you quit smoking your HDL (good) cholesterol increases and triglycerides decrease.  Stay active Exercise raises your HDL (good) cholesterol.  Walk, ride a bike, or swim for at least 20 minutes, 3-5 times per week.  Ideally, you should try to exercise for 30-60 minutes most days of the week.  Check with your Healthcare Provider before beginning an exercise program.  As we discussed, it is very important for you to carry your Epi-Pen around with you at all times, given your history of food allergy reaction to unrefined Cinnamon  I am sending in the referral/order for Cologuard as we discussed for colon cancer screening.  We will plan to see you back in 1 year for your next  wellness visit and we will determine together when to see you back again after your lab results have come back.  You will receive a survey after today's visit either digitally by e-mail or paper by [employer]. Your experiences and feedback matter to [place].  Please respond so we  know how we are doing as we provide care for you.  Call us with any questions/concerns/needs.  It is my goal to be available to you for your health concerns.  Thanks for choosing me to be a partner in your healthcare needs!  Harlin Rain, FNP-C Family Nurse Practitioner [facility] Phone: [phone]

## 2019-07-02 NOTE — Assessment & Plan Note (Signed)
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  Is aware of food allergies and makes good choices when home and eating meals not prepared at home.  Plan: 1) Refill EPI-PEN today 2) Carry 2 EPI-PENs with you at all times 3) USE the EPI-PEN at the first sign of anaphylaxis and get to the Emergency Room IMMEDIATELY!

## 2019-07-02 NOTE — Assessment & Plan Note (Signed)
Annual physical exam with no new findings.  Well adult with concerns for generalized severe joint pain with fatigue and joint stiffness.  Plan: 1. Obtain health maintenance screenings as above according to age. - Increase physical activity to 30 minutes most days of the week.  - Eat healthy diet high in vegetables and fruits; low in refined carbohydrates. - Screening labs and tests as ordered 2. Return 1 year for annual physical. 3. Labs for joint pain ordered today and will notify once results are received 4. Follow up for acute joint pain will be determined once lab results are received

## 2019-07-03 LAB — CBC WITH DIFFERENTIAL/PLATELET
Basophils Absolute: 0 10*3/uL (ref 0.0–0.2)
Basos: 1 %
EOS (ABSOLUTE): 0.1 10*3/uL (ref 0.0–0.4)
Eos: 2 %
Hematocrit: 41.7 % (ref 34.0–46.6)
Hemoglobin: 13.9 g/dL (ref 11.1–15.9)
Immature Grans (Abs): 0 10*3/uL (ref 0.0–0.1)
Immature Granulocytes: 0 %
Lymphocytes Absolute: 3 10*3/uL (ref 0.7–3.1)
Lymphs: 47 %
MCH: 30.5 pg (ref 26.6–33.0)
MCHC: 33.3 g/dL (ref 31.5–35.7)
MCV: 92 fL (ref 79–97)
Monocytes Absolute: 0.4 10*3/uL (ref 0.1–0.9)
Monocytes: 6 %
Neutrophils Absolute: 2.8 10*3/uL (ref 1.4–7.0)
Neutrophils: 44 %
Platelets: 214 10*3/uL (ref 150–450)
RBC: 4.55 x10E6/uL (ref 3.77–5.28)
RDW: 12.3 % (ref 11.7–15.4)
WBC: 6.4 10*3/uL (ref 3.4–10.8)

## 2019-07-03 LAB — THYROID PANEL WITH TSH
Free Thyroxine Index: 1.7 (ref 1.2–4.9)
T3 Uptake Ratio: 25 % (ref 24–39)
T4, Total: 6.9 ug/dL (ref 4.5–12.0)
TSH: 1.72 u[IU]/mL (ref 0.450–4.500)

## 2019-07-03 LAB — LIPID PANEL
Chol/HDL Ratio: 3.1 ratio (ref 0.0–4.4)
Cholesterol, Total: 188 mg/dL (ref 100–199)
HDL: 61 mg/dL (ref >39)
LDL Chol Calc (NIH): 99 mg/dL (ref 0–99)
Triglycerides: 165 mg/dL — ABNORMAL HIGH (ref 0–149)
VLDL Cholesterol Cal: 28 mg/dL (ref 5–40)

## 2019-07-03 LAB — SEDIMENTATION RATE: Sed Rate: 16 mm/hr (ref 0–40)

## 2019-07-03 LAB — RHEUMATOID FACTOR: Rheumatoid fact SerPl-aCnc: <10 IU/mL (ref 0.0–13.9)

## 2019-07-03 LAB — CMP14+EGFR
ALT: 54 IU/L — ABNORMAL HIGH (ref 0–32)
AST: 39 IU/L (ref 0–40)
Albumin/Globulin Ratio: 1.7 (ref 1.2–2.2)
Albumin: 4.7 g/dL (ref 3.8–4.9)
Alkaline Phosphatase: 112 IU/L (ref 39–117)
BUN/Creatinine Ratio: 13 (ref 9–23)
BUN: 11 mg/dL (ref 6–24)
Bilirubin Total: 0.4 mg/dL (ref 0.0–1.2)
CO2: 26 mmol/L (ref 20–29)
Calcium: 10 mg/dL (ref 8.7–10.2)
Chloride: 102 mmol/L (ref 96–106)
Creatinine, Ser: 0.82 mg/dL (ref 0.57–1.00)
GFR calc Af Amer: 96 mL/min/{1.73_m2} (ref >59)
GFR calc non Af Amer: 83 mL/min/{1.73_m2} (ref >59)
Globulin, Total: 2.7 g/dL (ref 1.5–4.5)
Glucose: 93 mg/dL (ref 65–99)
Potassium: 4.2 mmol/L (ref 3.5–5.2)
Sodium: 140 mmol/L (ref 134–144)
Total Protein: 7.4 g/dL (ref 6.0–8.5)

## 2019-07-03 LAB — C-REACTIVE PROTEIN: CRP: <1 mg/L (ref 0–10)

## 2019-07-03 LAB — ANA W/REFLEX IF POSITIVE: Anti Nuclear Antibody (ANA): NEGATIVE

## 2019-07-05 NOTE — Progress Notes (Signed)
Labs are all within normal limits and negative for the rheumatology labs.  ALT is slightly elevated for liver enzymes, but we can plan to repeat that in 6 months.

## 2019-07-06 ENCOUNTER — Other Ambulatory Visit: Payer: BC Managed Care – PPO

## 2019-07-06 ENCOUNTER — Other Ambulatory Visit: Payer: Self-pay | Admitting: Family Medicine

## 2019-07-06 ENCOUNTER — Telehealth: Payer: Self-pay

## 2019-07-06 ENCOUNTER — Other Ambulatory Visit: Payer: Self-pay

## 2019-07-06 DIAGNOSIS — M5430 Sciatica, unspecified side: Secondary | ICD-10-CM

## 2019-07-06 NOTE — Telephone Encounter (Signed)
Did she have the tick borne vector labs drawn?  Once we have those labs back we can work together for the next steps in her treatment plan.  I can send her to physical therapy for sciatica evaluation and exercises if she wants while we wait for the labs.

## 2019-07-06 NOTE — Telephone Encounter (Signed)
The pt would like to proceed with Physical Therapy.

## 2019-07-06 NOTE — Progress Notes (Signed)
Referral to PT placed per patient request

## 2019-07-06 NOTE — Telephone Encounter (Signed)
The pt concern with negative lab results what is the next step. She is currently having severe joint pain that causing her issues with daily activities.  She also complains severe left side sciatic pain.

## 2019-07-07 NOTE — Progress Notes (Signed)
Lyme Negative.  Awaiting additional tick borne vector results.

## 2019-07-10 LAB — ANAPLASMA PHAGOCYTOPHILA IGG/IGM AB
A. phagocytophila IgG Abs: <1:64 {titer}
A. phagocytophila IgM Abs: <1:20 {titer}

## 2019-07-10 LAB — EHRLICHIA ANTIBODY PANEL
E. CHAFFEENSIS AB IGG: <1:64 {titer}
E. CHAFFEENSIS AB IGM: <1:20 {titer}

## 2019-07-10 LAB — B. BURGDORFI ANTIBODIES: B burgdorferi Ab IgG+IgM: <0.9 index

## 2019-07-10 LAB — BABESIA MICROTI ANTIBODIES IGG, IGM
Babesia microti IgG: <1:64 {titer}
Babesia microti IgM: <1:20 {titer}

## 2019-07-12 ENCOUNTER — Other Ambulatory Visit: Payer: Self-pay | Admitting: Family Medicine

## 2019-07-12 DIAGNOSIS — M5431 Sciatica, right side: Secondary | ICD-10-CM

## 2019-07-12 MED ORDER — METHYLPREDNISOLONE 4 MG PO TBPK: ORAL_TABLET | ORAL | 0 refills | Status: DC

## 2019-07-12 NOTE — Progress Notes (Signed)
Anaplasma, Ehrlichia, Babesia and Lyme negative on labs.  It is possible that she is having generalized body aches from her COVID vaccine back in January.  Let's try the physical therapy and if no relief in her symptoms, we will plan to see her back in the office in 4-6 weeks, sooner if needed.

## 2019-07-12 NOTE — Progress Notes (Signed)
med

## 2019-07-16 DIAGNOSIS — M5416 Radiculopathy, lumbar region: Secondary | ICD-10-CM | POA: Insufficient documentation

## 2019-07-23 LAB — COLOGUARD
COLOGUARD: NEGATIVE
Cologuard: NEGATIVE

## 2019-07-26 ENCOUNTER — Encounter: Payer: Self-pay | Admitting: Family Medicine

## 2019-07-29 DIAGNOSIS — M1990 Unspecified osteoarthritis, unspecified site: Secondary | ICD-10-CM

## 2019-07-29 HISTORY — DX: Unspecified osteoarthritis, unspecified site: M19.90

## 2019-09-08 DIAGNOSIS — R748 Abnormal levels of other serum enzymes: Secondary | ICD-10-CM | POA: Insufficient documentation

## 2019-09-14 NOTE — Progress Notes (Signed)
PCP:  Verl Bangs, FNP   Chief Complaint  Patient presents with  . Gynecologic Exam     HPI:      [name] is a 49 y.o. [phone] who LMP was Patient's last menstrual period was 12/29/2018 (approximate)., presents today for her annual examination.  Her menses are absent since 9/20 with OCPs, so stopped them 12/20. No bleeding since. Hx of menorrhagia without OCPs in past.  No vasomotor sx. No migraines anymore.   Sex activity: single partner, contraception -post menopausal status. Pt has vag dryness, using lubricants with relief. Husband with ED, most likely due to type 1 DM. Last Pap: 05/14/17  Results were: no abnormalities /neg HPV DNA  Hx of STDs: none  Last mammogram: 05/29/17  Results were: normal--routine follow-up in 12 months There is a FH of breast cancer in her mom, mat grt aunt and pat grt aunt.  There is a FH of ovarian cancer in her MGM. Pt is My Risk neg 2016, her mom is BRCA neg 2015. IBIS=33%. The patient does do self-breast exams. Is taking Vit D supp. Has not had breast MRI but has met deductible this yr.  Tobacco use: The patient denies current or previous tobacco use. Alcohol use: none No drug use.  Exercise: not active  She does get adequate calcium and Vitamin D in her diet.  Labs/hyperlipidemia with PCP now.  Colonoscopy: cologuard 2021, repeat after 3 yrs.   Pt having neuropathy and arthritis sx since Moderna vaccine #1. Doing tx but has terrible hip/leg pains.   Past Medical History:  Diagnosis Date  . Allergy   . Asthma   . BRCA negative 2016   MyRisk neg  . Family history of breast cancer   . Family history of ovarian cancer   . Frequent headaches   . Hyperlipidemia   . Increased risk of breast cancer 2016   IBIS=33%  . Inflammatory arthritis 07/2019  . Physiological ovarian cysts   . Screening for colon cancer 2021   Cologuard neg; repeat after 3 yrs    Past Surgical History:  Procedure Laterality Date  . APPENDECTOMY    .  BIOPSY BREAST     left   . BREAST BIOPSY Left 2012   benign  . INGUINAL HERNIA REPAIR      Family History  Problem Relation Age of Onset  . Hypertension Mother   . Hyperlipidemia Mother   . Breast cancer Mother 39       ER/PR type BRCA Neg  . AAA (abdominal aortic aneurysm) Mother   . Hypothyroidism Mother   . Hypertension Father   . Hyperlipidemia Father   . Glaucoma Father   . Heart disease Paternal Grandfather 85  . Diabetes Paternal Grandfather   . Ovarian cancer Maternal Grandmother 40  . Hypothyroidism Maternal Grandmother   . Hypothyroidism Sister   . Hypothyroidism Paternal Grandmother   . Diabetes Paternal Grandmother   . Breast cancer Other 85  . Breast cancer Other 55  . Hypothyroidism Maternal Aunt     Social History   Socioeconomic History  . Marital status: Married    Spouse name: Not on file  . Number of children: Not on file  . Years of education: Not on file  . Highest education level: Not on file  Occupational History  . Not on file  Tobacco Use  . Smoking status: Never Smoker  . Smokeless tobacco: Never Used  Substance and Sexual Activity  . Alcohol use:  Yes    Comment: occasional  . Drug use: Never  . Sexual activity: Yes    Birth control/protection: None  Other Topics Concern  . Not on file  Social History Narrative  . Not on file   Social Determinants of Health   Financial Resource Strain:   . Difficulty of Paying Living Expenses:   Food Insecurity:   . Worried About Charity fundraiser in the Last Year:   . Arboriculturist in the Last Year:   Transportation Needs:   . Film/video editor (Medical):   Marland Kitchen Lack of Transportation (Non-Medical):   Physical Activity:   . Days of Exercise per Week:   . Minutes of Exercise per Session:   Stress:   . Feeling of Stress :   Social Connections:   . Frequency of Communication with Friends and Family:   . Frequency of Social Gatherings with Friends and Family:   . Attends Religious  Services:   . Active Member of Clubs or Organizations:   . Attends Archivist Meetings:   Marland Kitchen Marital Status:   Intimate Partner Violence:   . Fear of Current or Ex-Partner:   . Emotionally Abused:   Marland Kitchen Physically Abused:   . Sexually Abused:     Current Meds  Medication Sig  . acetaminophen (TYLENOL) 325 MG tablet Take 650 mg by mouth every 6 (six) hours as needed.  . cyclobenzaprine (FLEXERIL) 10 MG tablet cyclobenzaprine 10 mg tablet  . EPINEPHrine 0.3 mg/0.3 mL IJ SOAJ injection Inject 0.3 mLs (0.3 mg total) into the muscle as needed for anaphylaxis.  Marland Kitchen gabapentin (NEURONTIN) 300 MG capsule gabapentin 300 mg capsule  . loratadine (CLARITIN) 10 MG tablet Take 10 mg by mouth daily.  . meloxicam (MOBIC) 7.5 MG tablet meloxicam 7.5 mg tablet  . neomycin-polymyxin-dexameth (MAXITROL) 0.1 % OINT neomycin 3.5 mg/g-polymyxin B 10,000 unit/g-dexameth 0.1 % eye oint  APPLY 0.5 INCH(ES) IN RIGHT EYE NIGHTLY  . Red Yeast Rice Extract 300 MG CAPS Take 300 capsules by mouth daily as needed.   . simvastatin (ZOCOR) 10 MG tablet TAKE 1 TABLET BY MOUTH 3 DAYS PER WEEK.     ROS:  Review of Systems  Constitutional: Positive for fatigue. Negative for fever and unexpected weight change.  Respiratory: Negative for cough, shortness of breath and wheezing.   Cardiovascular: Negative for chest pain, palpitations and leg swelling.  Gastrointestinal: Negative for blood in stool, constipation, diarrhea, nausea and vomiting.  Endocrine: Negative for cold intolerance, heat intolerance and polyuria.  Genitourinary: Negative for dyspareunia, dysuria, flank pain, frequency, genital sores, hematuria, menstrual problem, pelvic pain, urgency, vaginal bleeding, vaginal discharge and vaginal pain.  Musculoskeletal: Positive for arthralgias and myalgias. Negative for back pain and joint swelling.  Skin: Negative for rash.  Neurological: Positive for headaches. Negative for dizziness, syncope,  light-headedness and numbness.  Hematological: Negative for adenopathy.  Psychiatric/Behavioral: Negative for agitation, confusion, sleep disturbance and suicidal ideas. The patient is not nervous/anxious.      Objective: BP 100/80   Ht 5' 2"  (1.575 m)   Wt 142 lb (64.4 kg)   LMP 12/29/2018 (Approximate)   BMI 25.97 kg/m    Physical Exam Constitutional:      Appearance: She is well-developed.  Genitourinary:     Vulva, vagina, cervix, uterus, right adnexa, left adnexa and rectum normal.     No vulval lesion or tenderness noted.     No vaginal discharge, erythema or tenderness.  No cervical polyp.     Uterus is not enlarged or tender.     No right or left adnexal mass present.     Right adnexa not tender.     Left adnexa not tender.  Rectum:     Guaiac result negative.     No tenderness.  Neck:     Thyroid: No thyromegaly.  Cardiovascular:     Rate and Rhythm: Normal rate and regular rhythm.     Heart sounds: Normal heart sounds. No murmur.  Pulmonary:     Effort: Pulmonary effort is normal.     Breath sounds: Normal breath sounds.  Chest:     Breasts:        Right: No mass, nipple discharge, skin change or tenderness.        Left: No mass, nipple discharge, skin change or tenderness.  Abdominal:     Palpations: Abdomen is soft.     Tenderness: There is no abdominal tenderness. There is no guarding.  Musculoskeletal:        General: Normal range of motion.     Cervical back: Normal range of motion.  Neurological:     General: No focal deficit present.     Mental Status: She is alert and oriented to person, place, and time.     Cranial Nerves: No cranial nerve deficit.  Skin:    General: Skin is warm and dry.  Psychiatric:        Mood and Affect: Mood normal.        Behavior: Behavior normal.        Thought Content: Thought content normal.        Judgment: Judgment normal.  Vitals reviewed.    Assessment/Plan: Encounter for annual routine gynecological  examination  Encounter for screening mammogram for malignant neoplasm of breast - Plan: MM 3D SCREEN BREAST BILATERAL; pt to sched mammo  Family history of breast cancer - Plan: MM 3D SCREEN BREAST BILATERAL; Pt is MyRisk neg.  Increased risk of breast cancer - Plan: MM 3D SCREEN BREAST BILATERAL; Cont monthly SBE, yearly CBE and mammos, Cont Vit D. Has met deductible this yr. Pt to call after mammo to sched scr breast MRI for this yr.         GYN counsel breast self exam, mammography screening, adequate intake of calcium and vitamin D, diet and exercise     F/U  Return in about 1 year (around 09/14/2020).  Gloristine Turrubiates B. Courtez Twaddle, PA-C 09/15/2019 10:50 AM

## 2019-09-15 ENCOUNTER — Other Ambulatory Visit: Payer: Self-pay

## 2019-09-15 ENCOUNTER — Ambulatory Visit (INDEPENDENT_AMBULATORY_CARE_PROVIDER_SITE_OTHER): Payer: BC Managed Care – PPO | Admitting: Obstetrics and Gynecology

## 2019-09-15 ENCOUNTER — Encounter: Payer: Self-pay | Admitting: Obstetrics and Gynecology

## 2019-09-15 VITALS — BP 100/80 | Ht 62.0 in | Wt 142.0 lb

## 2019-09-15 DIAGNOSIS — Z803 Family history of malignant neoplasm of breast: Secondary | ICD-10-CM | POA: Diagnosis not present

## 2019-09-15 DIAGNOSIS — Z1231 Encounter for screening mammogram for malignant neoplasm of breast: Secondary | ICD-10-CM | POA: Diagnosis not present

## 2019-09-15 DIAGNOSIS — Z9189 Other specified personal risk factors, not elsewhere classified: Secondary | ICD-10-CM

## 2019-09-15 DIAGNOSIS — Z01419 Encounter for gynecological examination (general) (routine) without abnormal findings: Secondary | ICD-10-CM | POA: Diagnosis not present

## 2019-09-15 NOTE — Patient Instructions (Addendum)
I value your feedback and entrusting us with your care. If you get a [facility] patient survey, I would appreciate you taking the time to let us know about your experience today. Thank you! ° °As of April 08, 2019, your lab results will be released to your MyChart immediately, before I even have a chance to see them. Please give me time to review them and contact you if there are any abnormalities. Thank you for your patience.  ° °[facility]: [phone] ° ° ° °

## 2019-09-30 ENCOUNTER — Ambulatory Visit
Admission: RE | Admit: 2019-09-30 | Discharge: 2019-09-30 | Disposition: A | Discharge status: Home / Self Care | Payer: BC Managed Care – PPO | Source: Ambulatory Visit | Attending: Obstetrics and Gynecology | Admitting: Obstetrics and Gynecology

## 2019-09-30 DIAGNOSIS — Z1231 Encounter for screening mammogram for malignant neoplasm of breast: Secondary | ICD-10-CM | POA: Diagnosis present

## 2019-09-30 DIAGNOSIS — Z803 Family history of malignant neoplasm of breast: Secondary | ICD-10-CM | POA: Diagnosis present

## 2019-09-30 DIAGNOSIS — Z9189 Other specified personal risk factors, not elsewhere classified: Secondary | ICD-10-CM

## 2019-10-01 ENCOUNTER — Encounter: Payer: Self-pay | Admitting: Obstetrics and Gynecology

## 2019-12-09 DIAGNOSIS — M0609 Rheumatoid arthritis without rheumatoid factor, multiple sites: Secondary | ICD-10-CM | POA: Insufficient documentation

## 2019-12-09 DIAGNOSIS — Z79899 Other long term (current) drug therapy: Secondary | ICD-10-CM | POA: Insufficient documentation

## 2019-12-27 ENCOUNTER — Encounter: Payer: Self-pay | Admitting: Family Medicine

## 2020-01-05 ENCOUNTER — Encounter: Payer: Self-pay | Admitting: Family Medicine

## 2020-02-16 ENCOUNTER — Other Ambulatory Visit: Payer: Self-pay

## 2020-02-16 ENCOUNTER — Ambulatory Visit (INDEPENDENT_AMBULATORY_CARE_PROVIDER_SITE_OTHER): Payer: BC Managed Care – PPO

## 2020-02-16 DIAGNOSIS — Z23 Encounter for immunization: Secondary | ICD-10-CM

## 2020-02-17 ENCOUNTER — Other Ambulatory Visit: Payer: BC Managed Care – PPO | Admitting: Family Medicine

## 2020-02-18 ENCOUNTER — Other Ambulatory Visit: Payer: Self-pay

## 2020-02-18 ENCOUNTER — Ambulatory Visit: Payer: BC Managed Care – PPO

## 2020-02-18 DIAGNOSIS — Z23 Encounter for immunization: Secondary | ICD-10-CM

## 2020-02-18 LAB — TB SKIN TEST
Induration: 0 mm
TB Skin Test: NEGATIVE

## 2020-02-18 NOTE — Progress Notes (Signed)
Pt comes in today for a PPD reading.

## 2020-06-15 ENCOUNTER — Encounter: Payer: Self-pay | Admitting: Unknown Physician Specialty

## 2020-06-15 ENCOUNTER — Telehealth: Payer: BC Managed Care – PPO | Admitting: Physician Assistant

## 2020-06-15 DIAGNOSIS — J029 Acute pharyngitis, unspecified: Secondary | ICD-10-CM

## 2020-06-15 NOTE — Progress Notes (Signed)
We are sorry that you are not feeling well.  Here is how we plan to help!  Your symptoms indicate a likely viral infection (Pharyngitis).   Pharyngitis is inflammation in the back of the throat which can cause a sore throat, scratchiness and sometimes difficulty swallowing.   Pharyngitis is typically caused by a respiratory virus and will just run its course.  Please keep in mind that your symptoms could last up to 10 days.    For throat pain, we recommend over the counter oral pain relief medications such as acetaminophen or aspirin, or anti-inflammatory medications such as ibuprofen or naproxen sodium.  Topical treatments such as oral throat lozenges or sprays may be used as needed.  Avoid close contact with loved ones, especially the very young and elderly.  Remember to wash your hands thoroughly throughout the day as this is the number one way to prevent the spread of infection and wipe down door knobs and counters with disinfectant.  After careful review of your answers, I would not recommend and antibiotic for your condition.  Antibiotics should not be used to treat conditions that we suspect are caused by viruses like the virus that causes the common cold or flu. However, some people can have Strep with atypical symptoms. You may need formal testing in clinic or office to confirm if your symptoms continue or worsen.  Providers prescribe antibiotics to treat infections caused by bacteria. Antibiotics are very powerful in treating bacterial infections when they are used properly.  To maintain their effectiveness, they should be used only when necessary.  Overuse of antibiotics has resulted in the development of super bugs that are resistant to treatment!    Home Care:  Only take medications as instructed by your medical team.  Do not drink alcohol while taking these medications.  A steam or ultrasonic humidifier can help congestion.  You can place a towel over your head and breathe in the steam  from hot water coming from a faucet.  Avoid close contacts especially the very young and the elderly.  Cover your mouth when you cough or sneeze.  Always remember to wash your hands.  Get Help Right Away If:  You develop worsening fever or throat pain.  You develop a severe head ache or visual changes.  Your symptoms persist after you have completed your treatment plan.  Make sure you  Understand these instructions.  Will watch your condition.  Will get help right away if you are not doing well or get worse.  Your e-visit answers were reviewed by a board certified advanced clinical practitioner to complete your personal care plan.  Depending on the condition, your plan could have included both over the counter or prescription medications.  If there is a problem please reply  once you have received a response from your provider.  Your safety is important to [place].  If you have drug allergies check your prescription carefully.    You can use MyChart to ask questions about todays visit, request a non-urgent call back, or ask for a work or school excuse for 24 hours related to this e-Visit. If it has been greater than 24 hours you will need to follow up with your provider, or enter a new e-Visit to address those concerns.  You will get an e-mail in the next two days asking about your experience.  I hope that your e-visit has been valuable and will speed your recovery. Thank you for using e-visits.   Greater than  5 minutes, yet less than 10 minutes of time have been spent researching, coordinating and implementing care for this patient today.

## 2020-06-26 ENCOUNTER — Ambulatory Visit: Payer: Self-pay | Admitting: Family Medicine

## 2020-06-26 ENCOUNTER — Other Ambulatory Visit: Payer: Self-pay

## 2020-06-26 ENCOUNTER — Encounter: Payer: Self-pay | Admitting: Family Medicine

## 2020-06-26 VITALS — BP 109/60 | HR 83 | Ht 62.0 in | Wt 142.0 lb

## 2020-06-26 DIAGNOSIS — N3001 Acute cystitis with hematuria: Secondary | ICD-10-CM

## 2020-06-26 DIAGNOSIS — R309 Painful micturition, unspecified: Secondary | ICD-10-CM

## 2020-06-26 LAB — POCT URINALYSIS DIPSTICK
Bilirubin, UA: NEGATIVE
Glucose, UA: NEGATIVE
Ketones, UA: NEGATIVE
Nitrite, UA: NEGATIVE
Protein, UA: POSITIVE — AB
Spec Grav, UA: 1.015 (ref 1.010–1.025)
Urobilinogen, UA: 0.2 E.U./dL
pH, UA: 6 (ref 5.0–8.0)

## 2020-06-26 MED ORDER — CEPHALEXIN 500 MG PO CAPS: 500.0000 mg | ORAL_CAPSULE | Freq: Three times a day (TID) | ORAL | 0 refills | Status: DC

## 2020-06-26 NOTE — Progress Notes (Addendum)
Subjective:    Patient ID: Jamie Stanley, female    DOB: 08/14/1970, 50 y.o.   MRN: [id number]  Jamie Stanley is a 50 y.o. female presenting on 06/26/2020 for Urinary Tract Infection   HPI   UTI Reports last few days with urinary urgency, frequency, yesterday worsened with sharp pain with urination, also last night with abdominal cramping. Admits urinary odor. No recent UTI Denies any fever chills nausea vomiting back or flank pain.  Depression screen [facility] 2/9 10/19/2018 01/21/2017  Decreased Interest 0 0  Down, Depressed, Hopeless 0 0  PHQ - 2 Score 0 0  Altered sleeping - 1  Tired, decreased energy - 1  Change in appetite - 0  Feeling bad or failure about yourself  - 0  Trouble concentrating - 0  Moving slowly or fidgety/restless - 0  Suicidal thoughts - 0  PHQ-9 Score - 2  Difficult doing work/chores - Not difficult at all    Social History   Tobacco Use  . Smoking status: Never Smoker  . Smokeless tobacco: Never Used  Vaping Use  . Vaping Use: Never used  Substance Use Topics  . Alcohol use: Yes    Comment: occasional  . Drug use: Never    Review of Systems Per HPI unless specifically indicated above     Objective:    BP 109/60   Pulse 83   Ht 5\' 2"  (1.575 m)   Wt 142 lb (64.4 kg)   SpO2 100%   BMI 25.97 kg/m   Wt Readings from Last 3 Encounters:  06/26/20 142 lb (64.4 kg)  09/15/19 142 lb (64.4 kg)  07/02/19 143 lb 12.8 oz (65.2 kg)    Physical Exam Vitals and nursing note reviewed.  Constitutional:      General: She is not in acute distress.    Appearance: She is well-developed and well-nourished. She is not diaphoretic.     Comments: Well-appearing, comfortable, cooperative  HENT:     Head: Normocephalic and atraumatic.     Mouth/Throat:     Mouth: Oropharynx is clear and moist.  Eyes:     General:        Right eye: No discharge.        Left eye: No discharge.     Conjunctiva/sclera: Conjunctivae normal.  Cardiovascular:     Rate and Rhythm:  Normal rate.  Pulmonary:     Effort: Pulmonary effort is normal.  Musculoskeletal:        General: No edema.  Skin:    General: Skin is warm and dry.     Findings: No erythema or rash.  Neurological:     Mental Status: She is alert and oriented to person, place, and time.  Psychiatric:        Mood and Affect: Mood and affect normal.        Behavior: Behavior normal.     Comments: Well groomed, good eye contact, normal speech and thoughts    Results for orders placed or performed in visit on 06/26/20  POCT Urinalysis Dipstick  Result Value Ref Range   Color, UA Yellow    Clarity, UA Cloudy    Glucose, UA Negative Negative   Bilirubin, UA Negative    Ketones, UA Negative    Spec Grav, UA 1.015 1.010 - 1.025   Blood, UA Moderate ++    pH, UA 6.0 5.0 - 8.0   Protein, UA Positive (A) Negative   Urobilinogen, UA 0.2 0.2 or 1.0 E.U./dL  Nitrite, UA Negative    Leukocytes, UA Moderate (2+) (A) Negative   Appearance     Odor        Assessment & Plan:   Problem List Items Addressed This Visit   None   Visit Diagnoses    Acute cystitis with hematuria    -  Primary   Relevant Medications   cephALEXin (KEFLEX) 500 MG capsule   Other Relevant Orders   POCT Urinalysis Dipstick (Completed)   Urine Culture   Pain with urination       Relevant Orders   POCT Urinalysis Dipstick (Completed)   Urine Culture      Clinically consistent with UTI and confirmed on UA. No recent UTIs or abx courses. No concern for pyelo today (no systemic symptoms, neg fever, back pain, n/v).  Plan: 1. UA dipstick with leuks and RBC and protein, cloudy 2. Ordered Urine culture 3. Keflex 500mg  TID x 7 days 4. Improve PO hydration 5. RTC if no improvement 1-2 weeks, red flags given to return sooner - May try AZO PRN restrictions given  She has taken Keflex in past. Has tolerated well without side effect or rash or allergic reaction. Historical PCN allergy unconfirmed history of rash remotely. She  agrees to take Keflex with precautions and close monitoring.   Meds ordered this encounter  Medications  . cephALEXin (KEFLEX) 500 MG capsule    Sig: Take 1 capsule (500 mg total) by mouth 3 (three) times daily. For 7 days    Dispense:  21 capsule    Refill:  0     Follow up plan: Return in about 1 week (around 07/03/2020), or if symptoms worsen or fail to improve, for UTI.   Nobie Putnam, [facility] 06/26/2020, 10:41 AM

## 2020-06-26 NOTE — Patient Instructions (Addendum)
Thank you for coming to the office today.  1. You have a Urinary Tract Infection - this is very common, your symptoms are reassuring and you should get better within 1 week on the antibiotics - Start Keflex 500mg  3 times daily for next 7 days, complete entire course, even if feeling better - We sent urine for a culture, we will call you within next few days if we need to change antibiotics - Please drink plenty of fluids, improve hydration over next 1 week  If symptoms worsening, developing nausea / vomiting, worsening back pain, fevers / chills / sweats, then please return for re-evaluation sooner.  If you take AZO OTC - limit this to 2-3 days MAX to avoid affecting kidneys  D-Mannose is a natural supplement that can actually help bind to urinary bacteria and reduce their effectiveness it can help prevent UTI from forming, and may reduce some symptoms. It likely cannot cure an active UTI but it is worth a try and good to prevent them with. Try 500mg  twice a day at a full dose if you want, or check package instructions for more info   Please schedule a Follow-up Appointment to: Return in about 1 week (around 07/03/2020), or if symptoms worsen or fail to improve, for UTI.  If you have any other questions or concerns, please feel free to call the office or send a message through [facility]. You may also schedule an earlier appointment if necessary.  Additionally, you may be receiving a survey about your experience at our office within a few days to 1 week by e-mail or mail. We value your feedback.  Nobie Putnam, DO [place]

## 2020-06-28 LAB — URINE CULTURE
MICRO NUMBER:: 11587080
SPECIMEN QUALITY:: ADEQUATE

## 2020-07-05 DIAGNOSIS — N3001 Acute cystitis with hematuria: Secondary | ICD-10-CM

## 2020-07-05 DIAGNOSIS — B379 Candidiasis, unspecified: Secondary | ICD-10-CM

## 2020-07-05 DIAGNOSIS — T3695XA Adverse effect of unspecified systemic antibiotic, initial encounter: Secondary | ICD-10-CM

## 2020-07-05 MED ORDER — CIPROFLOXACIN HCL 500 MG PO TABS: 500.0000 mg | ORAL_TABLET | Freq: Two times a day (BID) | ORAL | 0 refills | Status: DC

## 2020-07-05 MED ORDER — FLUCONAZOLE 150 MG PO TABS: ORAL_TABLET | ORAL | 0 refills | Status: DC

## 2020-07-05 NOTE — Addendum Note (Signed)
Addended by: Olin Hauser on: 07/05/2020 12:59 PM   Modules accepted: Orders

## 2020-07-11 ENCOUNTER — Other Ambulatory Visit: Payer: Self-pay

## 2020-07-11 ENCOUNTER — Encounter: Payer: Self-pay | Admitting: Unknown Physician Specialty

## 2020-07-11 ENCOUNTER — Ambulatory Visit (INDEPENDENT_AMBULATORY_CARE_PROVIDER_SITE_OTHER): Payer: BC Managed Care – PPO | Admitting: Unknown Physician Specialty

## 2020-07-11 VITALS — BP 126/63 | HR 69 | Temp 97.5°F | Resp 17 | Ht 62.0 in | Wt 145.6 lb

## 2020-07-11 DIAGNOSIS — G5601 Carpal tunnel syndrome, right upper limb: Secondary | ICD-10-CM | POA: Diagnosis not present

## 2020-07-11 DIAGNOSIS — F322 Major depressive disorder, single episode, severe without psychotic features: Secondary | ICD-10-CM | POA: Diagnosis not present

## 2020-07-11 DIAGNOSIS — M0609 Rheumatoid arthritis without rheumatoid factor, multiple sites: Secondary | ICD-10-CM

## 2020-07-11 DIAGNOSIS — Z789 Other specified health status: Secondary | ICD-10-CM

## 2020-07-11 DIAGNOSIS — F32A Depression, unspecified: Secondary | ICD-10-CM | POA: Insufficient documentation

## 2020-07-11 DIAGNOSIS — F419 Anxiety disorder, unspecified: Secondary | ICD-10-CM | POA: Insufficient documentation

## 2020-07-11 DIAGNOSIS — Z0000 Encounter for general adult medical examination without abnormal findings: Secondary | ICD-10-CM | POA: Diagnosis not present

## 2020-07-11 DIAGNOSIS — G5600 Carpal tunnel syndrome, unspecified upper limb: Secondary | ICD-10-CM | POA: Insufficient documentation

## 2020-07-11 MED ORDER — ESCITALOPRAM OXALATE 10 MG PO TABS: 10.0000 mg | ORAL_TABLET | Freq: Every day | ORAL | 1 refills | Status: DC

## 2020-07-11 NOTE — Assessment & Plan Note (Signed)
Continue f/u with rheumatology

## 2020-07-11 NOTE — Progress Notes (Signed)
BP 126/63 (BP Location: Left Arm, Patient Position: Sitting, Cuff Size: Normal)   Pulse 69   Temp (!) 97.5 F (36.4 C) (Temporal)   Resp 17   Ht 5' 2"  (1.575 m)   Wt 145 lb 9.6 oz (66 kg)   SpO2 100%   BMI 26.63 kg/m    Subjective:    Patient ID: Jamie Stanley, female    DOB: 03/02/1971, 50 y.o.   MRN: [id number]  HPI: Jamie Stanley is a 50 y.o. female  Chief Complaint  Patient presents with  . Annual Exam    Pt concern she might need to be on some anti depression medication or anxiety meds.   . Insomnia    Difficulty falling asleep, staying asleep. X 2 yrs   . Wrist Pain    Intermittent Rt side wrist pain, mostly in the morning unsure if its related to her RA. Constant Numbness in the middle and fourth finger. X 2 mths    The 10-year ASCVD risk score Mikey Bussing DC Jr., et al., 2013) is: 1.2%   Values used to calculate the score:     Age: 70 years     Sex: Female     Is Non-Hispanic African American: No     Diabetic: No     Tobacco smoker: No     Systolic Blood Pressure: 277 mmHg     Is BP treated: No     HDL Cholesterol: 61 mg/dL     Total Cholesterol: 188 mg/dL  Depression/insomnia Pt states she has had problems for a couple of years since menopause.  Her husband says she is OCD and "not a nice person" anymore.  States she is anxious "all the time."  Denies mania symptoms.   Depression screen [facility] 2/9 07/11/2020 10/19/2018 01/21/2017  Decreased Interest 2 0 0  Down, Depressed, Hopeless 2 0 0  PHQ - 2 Score 4 0 0  Altered sleeping 3 - 1  Tired, decreased energy 3 - 1  Change in appetite 3 - 0  Feeling bad or failure about yourself  2 - 0  Trouble concentrating 2 - 0  Moving slowly or fidgety/restless 2 - 0  Suicidal thoughts 0 - 0  PHQ-9 Score 19 - 2  Difficult doing work/chores Very difficult - Not difficult at all   GAD 7 : Generalized Anxiety Score 07/11/2020  Nervous, Anxious, on Edge 3  Control/stop worrying 3  Worry too much - different things 3  Trouble relaxing 3   Restless 3  Easily annoyed or irritable 3  Afraid - awful might happen 3  Total GAD 7 Score 21  Anxiety Difficulty Very difficult    Past Medical History:  Diagnosis Date  . Allergy   . Asthma   . BRCA negative 2016   MyRisk neg  . Family history of breast cancer   . Family history of ovarian cancer   . Frequent headaches   . Hyperlipidemia   . Increased risk of breast cancer 2016   IBIS=33%  . Inflammatory arthritis 07/2019  . Physiological ovarian cysts   . Screening for colon cancer 2021   Cologuard neg; repeat after 3 yrs   Social History   Socioeconomic History  . Marital status: Married    Spouse name: Not on file  . Number of children: Not on file  . Years of education: Not on file  . Highest education level: Not on file  Occupational History  . Not on file  Tobacco Use  .  Smoking status: Never Smoker  . Smokeless tobacco: Never Used  Vaping Use  . Vaping Use: Never used  Substance and Sexual Activity  . Alcohol use: Not Currently    Comment: occasional  . Drug use: Never  . Sexual activity: Yes    Birth control/protection: None  Other Topics Concern  . Not on file  Social History Narrative  . Not on file   Social Determinants of Health   Financial Resource Strain: Not on file  Food Insecurity: Not on file  Transportation Needs: Not on file  Physical Activity: Not on file  Stress: Not on file  Social Connections: Not on file  Intimate Partner Violence: Not on file   Family History  Problem Relation Age of Onset  . Hypertension Mother   . Hyperlipidemia Mother   . Breast cancer Mother 38       ER/PR type BRCA Neg  . AAA (abdominal aortic aneurysm) Mother   . Hypothyroidism Mother   . Hypertension Father   . Hyperlipidemia Father   . Glaucoma Father   . Heart disease Paternal Grandfather 62  . Diabetes Paternal Grandfather   . Ovarian cancer Maternal Grandmother 14  . Hypothyroidism Maternal Grandmother   . Hypothyroidism Sister   .  Hypothyroidism Paternal Grandmother   . Diabetes Paternal Grandmother   . Breast cancer Other 40  . Breast cancer Other 50  . Hypothyroidism Maternal Aunt      Relevant past medical, surgical, family and social history reviewed and updated as indicated. Interim medical history since our last visit reviewed. Allergies and medications reviewed and updated.  Review of Systems  Constitutional: Negative.   HENT: Negative.   Eyes: Negative.   Respiratory: Negative.   Cardiovascular: Negative.   Gastrointestinal: Negative.   Endocrine: Negative.   Genitourinary: Negative.   Musculoskeletal:       Left wrist pain as described above  Neurological: Negative.   Psychiatric/Behavioral: The patient is nervous/anxious.     Per HPI unless specifically indicated above     Objective:    BP 126/63 (BP Location: Left Arm, Patient Position: Sitting, Cuff Size: Normal)   Pulse 69   Temp (!) 97.5 F (36.4 C) (Temporal)   Resp 17   Ht 5' 2"  (1.575 m)   Wt 145 lb 9.6 oz (66 kg)   SpO2 100%   BMI 26.63 kg/m   Wt Readings from Last 3 Encounters:  07/11/20 145 lb 9.6 oz (66 kg)  06/26/20 142 lb (64.4 kg)  09/15/19 142 lb (64.4 kg)    Physical Exam Constitutional:      Appearance: She is well-developed.  HENT:     Head: Normocephalic and atraumatic.  Eyes:     General: No scleral icterus.       Right eye: No discharge.        Left eye: No discharge.     Pupils: Pupils are equal, round, and reactive to light.  Neck:     Thyroid: No thyromegaly.     Vascular: No carotid bruit.  Cardiovascular:     Rate and Rhythm: Normal rate and regular rhythm.     Heart sounds: Normal heart sounds. No murmur heard. No friction rub. No gallop.   Pulmonary:     Effort: Pulmonary effort is normal. No respiratory distress.     Breath sounds: Normal breath sounds. No wheezing or rales.  Abdominal:     General: Bowel sounds are normal.     Palpations: Abdomen is soft.  Tenderness: There is no  abdominal tenderness. There is no rebound.  Musculoskeletal:        General: Normal range of motion.     Cervical back: Normal range of motion and neck supple.     Comments: Positive Phalen's sign  Lymphadenopathy:     Cervical: No cervical adenopathy.  Skin:    General: Skin is warm and dry.     Findings: No rash.  Neurological:     Mental Status: She is alert and oriented to person, place, and time.  Psychiatric:        Speech: Speech normal.        Behavior: Behavior normal.        Thought Content: Thought content normal.        Judgment: Judgment normal.      Assessment & Plan:   Problem List Items Addressed This Visit      Unprioritized   Carpal tunnel syndrome    Positive Phalen's. Wrist splint right wrist at night.  Follow up with rheumatology      Relevant Medications   escitalopram (LEXAPRO) 10 MG tablet   Depression    New diagnosis.  Rx for Escitalopram 10 mg daily.  Discussed life-style changes to decrease stress.        Relevant Medications   escitalopram (LEXAPRO) 10 MG tablet   Rheumatoid arthritis of multiple sites with negative rheumatoid factor ([place])    Continue f/u with rheumatology      Statin intolerance    On Simvistatin twice a week.  However, ASCVD risk is 1.2%.  Will stop statin in follow with lipid response.  If restarting, I would recommend Atorvastatin due to it's longer half life and data for less than daily dose.         Other Visit Diagnoses    Routine general medical examination at a health care facility    -  Primary   Relevant Orders   HIV Antibody (routine testing w rflx)   CBC with Differential/Platelet   Comprehensive metabolic panel   Hepatitis C antibody   Lipid panel   TSH       Follow up plan: Return in about 2 weeks (around 07/25/2020).

## 2020-07-11 NOTE — Assessment & Plan Note (Signed)
On Simvistatin twice a week.  However, ASCVD risk is 1.2%.  Will stop statin in follow with lipid response.  If restarting, I would recommend Atorvastatin due to it's longer half life and data for less than daily dose.

## 2020-07-11 NOTE — Assessment & Plan Note (Addendum)
New diagnosis.  Rx for Escitalopram 10 mg daily.  Discussed life-style changes to decrease stress.

## 2020-07-11 NOTE — Assessment & Plan Note (Signed)
Positive Phalen's. Wrist splint right wrist at night.  Follow up with rheumatology

## 2020-07-12 ENCOUNTER — Encounter: Payer: BC Managed Care – PPO | Admitting: Family Medicine

## 2020-07-12 LAB — CBC WITH DIFFERENTIAL/PLATELET
Absolute Monocytes: 325 cells/uL (ref 200–950)
Basophils Absolute: 22 cells/uL (ref 0–200)
Basophils Relative: 0.4 %
Eosinophils Absolute: 143 cells/uL (ref 15–500)
Eosinophils Relative: 2.6 %
HCT: 39.2 % (ref 35.0–45.0)
Hemoglobin: 13 g/dL (ref 11.7–15.5)
Lymphs Abs: 2503 cells/uL (ref 850–3900)
MCH: 30.2 pg (ref 27.0–33.0)
MCHC: 33.2 g/dL (ref 32.0–36.0)
MCV: 91.2 fL (ref 80.0–100.0)
MPV: 10.8 fL (ref 7.5–12.5)
Monocytes Relative: 5.9 %
Neutro Abs: 2508 cells/uL (ref 1500–7800)
Neutrophils Relative %: 45.6 %
Platelets: 184 10*3/uL (ref 140–400)
RBC: 4.3 10*6/uL (ref 3.80–5.10)
RDW: 12.1 % (ref 11.0–15.0)
Total Lymphocyte: 45.5 %
WBC: 5.5 10*3/uL (ref 3.8–10.8)

## 2020-07-12 LAB — LIPID PANEL
Cholesterol: 204 mg/dL — ABNORMAL HIGH (ref <200)
HDL: 78 mg/dL (ref >=50)
LDL Cholesterol (Calc): 112 mg/dL (calc) — ABNORMAL HIGH
Non-HDL Cholesterol (Calc): 126 mg/dL (calc) (ref <130)
Total CHOL/HDL Ratio: 2.6 (calc) (ref <5.0)
Triglycerides: 57 mg/dL (ref <150)

## 2020-07-12 LAB — COMPREHENSIVE METABOLIC PANEL
AG Ratio: 2 (calc) (ref 1.0–2.5)
ALT: 34 U/L — ABNORMAL HIGH (ref 6–29)
AST: 29 U/L (ref 10–35)
Albumin: 4.5 g/dL (ref 3.6–5.1)
Alkaline phosphatase (APISO): 99 U/L (ref 37–153)
BUN: 12 mg/dL (ref 7–25)
CO2: 27 mmol/L (ref 20–32)
Calcium: 9.2 mg/dL (ref 8.6–10.4)
Chloride: 106 mmol/L (ref 98–110)
Creat: 0.8 mg/dL (ref 0.50–1.05)
Globulin: 2.2 g/dL (calc) (ref 1.9–3.7)
Glucose, Bld: 92 mg/dL (ref 65–99)
Potassium: 3.9 mmol/L (ref 3.5–5.3)
Sodium: 141 mmol/L (ref 135–146)
Total Bilirubin: 0.5 mg/dL (ref 0.2–1.2)
Total Protein: 6.7 g/dL (ref 6.1–8.1)

## 2020-07-12 LAB — HEPATITIS C ANTIBODY
Hepatitis C Ab: NONREACTIVE
SIGNAL TO CUT-OFF: 0.01 (ref <1.00)

## 2020-07-12 LAB — HIV ANTIBODY (ROUTINE TESTING W REFLEX): HIV 1&2 Ab, 4th Generation: NONREACTIVE

## 2020-07-12 LAB — TSH: TSH: 1.18 mIU/L

## 2020-07-25 ENCOUNTER — Other Ambulatory Visit: Payer: Self-pay

## 2020-07-25 ENCOUNTER — Encounter: Payer: Self-pay | Admitting: Unknown Physician Specialty

## 2020-07-25 ENCOUNTER — Ambulatory Visit (INDEPENDENT_AMBULATORY_CARE_PROVIDER_SITE_OTHER): Payer: BC Managed Care – PPO | Admitting: Unknown Physician Specialty

## 2020-07-25 DIAGNOSIS — F322 Major depressive disorder, single episode, severe without psychotic features: Secondary | ICD-10-CM

## 2020-07-25 NOTE — Assessment & Plan Note (Signed)
Doing much better with Escitalopram 10 mg.  Continue medication and recheck 6 weeks.

## 2020-07-25 NOTE — Progress Notes (Signed)
BP 119/62 (BP Location: Left Arm, Patient Position: Sitting, Cuff Size: Normal)   Pulse 67   Temp (!) 97.1 F (36.2 C) (Temporal)   Ht 5\' 2"  (1.575 m)   Wt 142 lb 6.4 oz (64.6 kg)   SpO2 100%   BMI 26.05 kg/m    Subjective:    Patient ID: Jamie Stanley, female    DOB: 1970/07/30, 50 y.o.   MRN: [id number]  HPI: Jamie Stanley is a 50 y.o. female  Chief Complaint  Patient presents with  . Depression    The medication Escitalopram pt has noticed improvement.   Pt is doing will with Escitalopram.  Bot she and her family note decrease In anxiety and she can sleep better.    Depression screen [facility] 2/9 07/25/2020 07/11/2020 10/19/2018 01/21/2017  Decreased Interest 0 2 0 0  Down, Depressed, Hopeless 0 2 0 0  PHQ - 2 Score 0 4 0 0  Altered sleeping 1 3 - 1  Tired, decreased energy 1 3 - 1  Change in appetite 0 3 - 0  Feeling bad or failure about yourself  0 2 - 0  Trouble concentrating 0 2 - 0  Moving slowly or fidgety/restless 0 2 - 0  Suicidal thoughts 0 0 - 0  PHQ-9 Score 2 19 - 2  Difficult doing work/chores Not difficult at all Very difficult - Not difficult at all   GAD 7 : Generalized Anxiety Score 07/25/2020 07/11/2020  Nervous, Anxious, on Edge 1 3  Control/stop worrying 1 3  Worry too much - different things 1 3  Trouble relaxing 1 3  Restless 0 3  Easily annoyed or irritable 1 3  Afraid - awful might happen 0 3  Total GAD 7 Score 5 21  Anxiety Difficulty - Very difficult     Relevant past medical, surgical, family and social history reviewed and updated as indicated. Interim medical history since our last visit reviewed. Allergies and medications reviewed and updated.  Review of Systems  Per HPI unless specifically indicated above     Objective:    BP 119/62 (BP Location: Left Arm, Patient Position: Sitting, Cuff Size: Normal)   Pulse 67   Temp (!) 97.1 F (36.2 C) (Temporal)   Ht 5\' 2"  (1.575 m)   Wt 142 lb 6.4 oz (64.6 kg)   SpO2 100%   BMI 26.05 kg/m    Wt Readings from Last 3 Encounters:  07/25/20 142 lb 6.4 oz (64.6 kg)  07/11/20 145 lb 9.6 oz (66 kg)  06/26/20 142 lb (64.4 kg)    Physical Exam Constitutional:      General: She is not in acute distress.    Appearance: Normal appearance. She is well-developed.  HENT:     Head: Normocephalic and atraumatic.  Eyes:     General: Lids are normal. No scleral icterus.       Right eye: No discharge.        Left eye: No discharge.     Conjunctiva/sclera: Conjunctivae normal.  Cardiovascular:     Rate and Rhythm: Normal rate.  Pulmonary:     Effort: Pulmonary effort is normal.  Abdominal:     Palpations: There is no hepatomegaly or splenomegaly.  Musculoskeletal:        General: Normal range of motion.  Skin:    Coloration: Skin is not pale.     Findings: No rash.  Neurological:     Mental Status: She is alert and oriented to person, place,  and time.  Psychiatric:        Behavior: Behavior normal.        Thought Content: Thought content normal.        Judgment: Judgment normal.     Results for orders placed or performed in visit on 07/11/20  HIV Antibody (routine testing w rflx)  Result Value Ref Range   HIV 1&2 Ab, 4th Generation NON-REACTIVE NON-REACTI  CBC with Differential/Platelet  Result Value Ref Range   WBC 5.5 3.8 - 10.8 Thousand/uL   RBC 4.30 3.80 - 5.10 Million/uL   Hemoglobin 13.0 11.7 - 15.5 g/dL   HCT 39.2 35.0 - 45.0 %   MCV 91.2 80.0 - 100.0 fL   MCH 30.2 27.0 - 33.0 pg   MCHC 33.2 32.0 - 36.0 g/dL   RDW 12.1 11.0 - 15.0 %   Platelets 184 140 - 400 Thousand/uL   MPV 10.8 7.5 - 12.5 fL   Neutro Abs 2,508 1,500 - 7,800 cells/uL   Lymphs Abs 2,503 850 - 3,900 cells/uL   Absolute Monocytes 325 200 - 950 cells/uL   Eosinophils Absolute 143 15 - 500 cells/uL   Basophils Absolute 22 0 - 200 cells/uL   Neutrophils Relative % 45.6 %   Total Lymphocyte 45.5 %   Monocytes Relative 5.9 %   Eosinophils Relative 2.6 %   Basophils Relative 0.4 %   Comprehensive metabolic panel  Result Value Ref Range   Glucose, Bld 92 65 - 99 mg/dL   BUN 12 7 - 25 mg/dL   Creat 0.80 0.50 - 1.05 mg/dL   BUN/Creatinine Ratio NOT APPLICABLE 6 - 22 (calc)   Sodium 141 135 - 146 mmol/L   Potassium 3.9 3.5 - 5.3 mmol/L   Chloride 106 98 - 110 mmol/L   CO2 27 20 - 32 mmol/L   Calcium 9.2 8.6 - 10.4 mg/dL   Total Protein 6.7 6.1 - 8.1 g/dL   Albumin 4.5 3.6 - 5.1 g/dL   Globulin 2.2 1.9 - 3.7 g/dL (calc)   AG Ratio 2.0 1.0 - 2.5 (calc)   Total Bilirubin 0.5 0.2 - 1.2 mg/dL   Alkaline phosphatase (APISO) 99 37 - 153 U/L   AST 29 10 - 35 U/L   ALT 34 (H) 6 - 29 U/L  Hepatitis C antibody  Result Value Ref Range   Hepatitis C Ab NON-REACTIVE NON-REACTI   SIGNAL TO CUT-OFF 0.01 <1.00  Lipid panel  Result Value Ref Range   Cholesterol 204 (H) <200 mg/dL   HDL 78 > OR = 50 mg/dL   Triglycerides 57 <150 mg/dL   LDL Cholesterol (Calc) 112 (H) mg/dL (calc)   Total CHOL/HDL Ratio 2.6 <5.0 (calc)   Non-HDL Cholesterol (Calc) 126 <130 mg/dL (calc)  TSH  Result Value Ref Range   TSH 1.18 mIU/L      Assessment & Plan:   Problem List Items Addressed This Visit      Unprioritized   Depression    Doing much better with Escitalopram 10 mg.  Continue medication and recheck 6 weeks.            Follow up plan: Return in about 6 weeks (around 09/05/2020).

## 2020-09-08 ENCOUNTER — Ambulatory Visit: Payer: BC Managed Care – PPO | Admitting: Internal Medicine

## 2020-09-15 ENCOUNTER — Other Ambulatory Visit: Payer: Self-pay | Admitting: Internal Medicine

## 2020-09-15 ENCOUNTER — Encounter: Payer: Self-pay | Admitting: Internal Medicine

## 2020-09-15 ENCOUNTER — Ambulatory Visit: Payer: BC Managed Care – PPO | Admitting: Internal Medicine

## 2020-09-15 ENCOUNTER — Other Ambulatory Visit: Payer: Self-pay

## 2020-09-15 VITALS — BP 98/60 | HR 71 | Temp 97.7°F | Resp 18 | Ht 62.0 in | Wt 144.0 lb

## 2020-09-15 DIAGNOSIS — B351 Tinea unguium: Secondary | ICD-10-CM | POA: Insufficient documentation

## 2020-09-15 DIAGNOSIS — M0609 Rheumatoid arthritis without rheumatoid factor, multiple sites: Secondary | ICD-10-CM | POA: Diagnosis not present

## 2020-09-15 DIAGNOSIS — Z789 Other specified health status: Secondary | ICD-10-CM

## 2020-09-15 DIAGNOSIS — F419 Anxiety disorder, unspecified: Secondary | ICD-10-CM | POA: Diagnosis not present

## 2020-09-15 DIAGNOSIS — E7800 Pure hypercholesterolemia, unspecified: Secondary | ICD-10-CM

## 2020-09-15 MED ORDER — EFINACONAZOLE 10 % EX SOLN: 1.0000 "application " | Freq: Every day | CUTANEOUS | 2 refills | Status: DC

## 2020-09-15 MED ORDER — ESCITALOPRAM OXALATE 10 MG PO TABS: 10.0000 mg | ORAL_TABLET | Freq: Every day | ORAL | 1 refills | Status: DC

## 2020-09-15 NOTE — Assessment & Plan Note (Signed)
Will trial Jublia daily x 48 weeks

## 2020-09-15 NOTE — Assessment & Plan Note (Signed)
Will check lipid profile with annual exam 

## 2020-09-15 NOTE — Patient Instructions (Signed)

## 2020-09-15 NOTE — Assessment & Plan Note (Signed)
Continue MTX, Folic Acid, Meloxicama and Tylenol as prescribed She will continue to see rheumatology, will follow

## 2020-09-15 NOTE — Assessment & Plan Note (Signed)
Will check lipid profile with annual exam

## 2020-09-15 NOTE — Assessment & Plan Note (Signed)
Improved on Escitalopram, refilled today Support offered

## 2020-09-15 NOTE — Progress Notes (Signed)
Subjective:    Patient ID: Jamie Stanley, female    DOB: [birth year], 50 y.o.   MRN: [id number]  HPI  Pt presents to the clinic today for follow up of chronic conditions. She is establishing care with me today, transferring care from Cyndia Skeeters, NP.  Anxiety: Triggered by general life stress, and working 2 jobs. Recently started on Escitalopram. She reports this has been working well for her. She is not currently seeing a therapist. She denies depression SI/HI.  RA: Mainly in her hands and feet. Managed with MTX, Folic Acid, Meloxicam and Tylenol as prescribed. She follows with rheumatology and reports they plans to take her off the MTX and Folic Acid at the next followup in August.  HLD: Her last LDL was 99, triglycerides 165, 06/2019. She is statin intolerant due to myalgias. She tries to consume a low fat diet.  She is concerned about toenail fungus. She has thick, discolored, painful 2nd toenail, right foot. She has not tried anything OTC for this . She would like to try Jublia.  Review of Systems      Past Medical History:  Diagnosis Date  . Allergy   . Asthma   . BRCA negative 2016   MyRisk neg  . Dysplastic nevus 12/10/2017   L back infrascapular 7.0 cm lat to spine - mild   . Family history of breast cancer   . Family history of ovarian cancer   . Frequent headaches   . Hyperlipidemia   . Increased risk of breast cancer 2016   IBIS=33%  . Inflammatory arthritis 07/2019  . Physiological ovarian cysts   . Screening for colon cancer 2021   Cologuard neg; repeat after 3 yrs    Current Outpatient Medications  Medication Sig Dispense Refill  . acetaminophen (TYLENOL) 325 MG tablet Take 650 mg by mouth every 6 (six) hours as needed.    . cyclobenzaprine (FLEXERIL) 10 MG tablet Take by mouth as needed.    Marland Kitchen EPINEPHrine 0.3 mg/0.3 mL IJ SOAJ injection Inject 0.3 mLs (0.3 mg total) into the muscle as needed for anaphylaxis. 2 each 1  . escitalopram (LEXAPRO) 10 MG tablet  Take 1 tablet (10 mg total) by mouth daily. 90 tablet 1  . folic acid (FOLVITE) 1 MG tablet Take by mouth.    . loratadine (CLARITIN) 10 MG tablet Take 10 mg by mouth daily.    . meloxicam (MOBIC) 7.5 MG tablet Take 7.5 mg by mouth as needed.    . methotrexate (RHEUMATREX) 2.5 MG tablet Take 2.5 mg by mouth once a week.     No current facility-administered medications for this visit.    Allergies  Allergen Reactions  . Cinnamon Anaphylaxis  . Penicillins Rash    Rash   . Demerol [Meperidine] Nausea Only    Nausea      Family History  Problem Relation Age of Onset  . Hypertension Mother   . Hyperlipidemia Mother   . Breast cancer Mother 30       ER/PR type BRCA Neg  . AAA (abdominal aortic aneurysm) Mother   . Hypothyroidism Mother   . Hypertension Father   . Hyperlipidemia Father   . Glaucoma Father   . Heart disease Paternal Grandfather 36  . Diabetes Paternal Grandfather   . Ovarian cancer Maternal Grandmother 50  . Hypothyroidism Maternal Grandmother   . Hypothyroidism Sister   . Hypothyroidism Paternal Grandmother   . Diabetes Paternal Grandmother   . Breast cancer Other 60  .  Breast cancer Other 44  . Hypothyroidism Maternal Aunt     Social History   Socioeconomic History  . Marital status: Married    Spouse name: Not on file  . Number of children: Not on file  . Years of education: Not on file  . Highest education level: Not on file  Occupational History  . Not on file  Tobacco Use  . Smoking status: Never Smoker  . Smokeless tobacco: Never Used  Vaping Use  . Vaping Use: Never used  Substance and Sexual Activity  . Alcohol use: Not Currently    Comment: occasional  . Drug use: Never  . Sexual activity: Yes    Birth control/protection: None  Other Topics Concern  . Not on file  Social History Narrative  . Not on file   Social Determinants of Health   Financial Resource Strain: Not on file  Food Insecurity: Not on file  Transportation  Needs: Not on file  Physical Activity: Not on file  Stress: Not on file  Social Connections: Not on file  Intimate Partner Violence: Not on file     Constitutional: Denies fever, malaise, fatigue, headache or abrupt weight changes.  Respiratory: Denies difficulty breathing, shortness of breath, cough or sputum production.   Cardiovascular: Denies chest pain, chest tightness, palpitations or swelling in the hands or feet.  Musculoskeletal: Pt reports intermittent joint pain. Denies decrease in range of motion, difficulty with gait, muscle pain or joint swelling.  Skin:Pt reports toenail fungus. Denies redness, rashes, lesions or ulcercations.  Neurological: Denies dizziness, difficulty with memory, difficulty with speech or problems with balance and coordination.  Psych: Pt has a history of anxiety. Denies depression, SI/HI.  No other specific complaints in a complete review of systems (except as listed in HPI above).  Objective:   Physical Exam  BP 98/60 (BP Location: Right Arm, Patient Position: Sitting, Cuff Size: Normal)   Pulse 71   Temp 97.7 F (36.5 C) (Temporal)   Resp 18   Ht 5' 2"  (1.575 m)   Wt 144 lb (65.3 kg)   BMI 26.34 kg/m   Wt Readings from Last 3 Encounters:  07/25/20 142 lb 6.4 oz (64.6 kg)  07/11/20 145 lb 9.6 oz (66 kg)  06/26/20 142 lb (64.4 kg)    General: Appears her stated age, well developed, well nourished in NAD. Skin: Warm, dry and intact.Thick, discolored fungal 2nd toenail, right foot. HEENT: Head: normal shape and size; Eyes: sclera white and EOMs intact;  Cardiovascular: Normal rate. Pulmonary/Chest: Normal effort. Musculoskeletal: No difficulty with gait.  Neurological: Alert and oriented.   Psychiatric: Mood and affect normal. Behavior is normal. Judgment and thought content normal.    BMET    Component Value Date/Time   NA 141 07/11/2020 1102   NA 140 07/02/2019 0000   K 3.9 07/11/2020 1102   CL 106 07/11/2020 1102   CO2 27  07/11/2020 1102   GLUCOSE 92 07/11/2020 1102   BUN 12 07/11/2020 1102   BUN 11 07/02/2019 0000   CREATININE 0.80 07/11/2020 1102   CALCIUM 9.2 07/11/2020 1102   GFRNONAA 83 07/02/2019 0000   GFRNONAA 69 01/06/2019 1151   GFRAA 96 07/02/2019 0000   GFRAA 80 01/06/2019 1151    Lipid Panel     Component Value Date/Time   CHOL 204 (H) 07/11/2020 1102   CHOL 188 07/02/2019 0000   TRIG 57 07/11/2020 1102   HDL 78 07/11/2020 1102   HDL 61 07/02/2019 0000  CHOLHDL 2.6 07/11/2020 1102   LDLCALC 112 (H) 07/11/2020 1102    CBC    Component Value Date/Time   WBC 5.5 07/11/2020 1102   RBC 4.30 07/11/2020 1102   HGB 13.0 07/11/2020 1102   HGB 13.9 07/02/2019 0000   HCT 39.2 07/11/2020 1102   HCT 41.7 07/02/2019 0000   PLT 184 07/11/2020 1102   PLT 214 07/02/2019 0000   MCV 91.2 07/11/2020 1102   MCV 92 07/02/2019 0000   MCH 30.2 07/11/2020 1102   MCHC 33.2 07/11/2020 1102   RDW 12.1 07/11/2020 1102   RDW 12.3 07/02/2019 0000   LYMPHSABS 2,503 07/11/2020 1102   LYMPHSABS 3.0 07/02/2019 0000   EOSABS 143 07/11/2020 1102   EOSABS 0.1 07/02/2019 0000   BASOSABS 22 07/11/2020 1102   BASOSABS 0.0 07/02/2019 0000    Hgb A1C No results found for: HGBA1C         Assessment & Plan:    Webb Silversmith, NP This visit occurred during the SARS-CoV-2 public health emergency.  Safety protocols were in place, including screening questions prior to the visit, additional usage of staff PPE, and extensive cleaning of exam room while observing appropriate contact time as indicated for disinfecting solutions.

## 2020-09-15 NOTE — Telephone Encounter (Signed)
Requested medication (s) are due for refill today: Yes  Requested medication (s) are on the active medication list: Yes  Last refill:  09/15/20  Future visit scheduled: Yes  Notes to clinic:  See highlighted notes from pharmacy     Requested Prescriptions  Pending Prescriptions Disp Refills   terbinafine (LAMISIL) 250 MG tablet [Pharmacy Med Name: TERBINAFINE HCL 250 MG TABLET] 1 tablet 0      Off-Protocol Failed - 09/15/2020  1:45 PM      Failed - Medication not assigned to a protocol, review manually.      Passed - Valid encounter within last 12 months    Recent Outpatient Visits           Today Rheumatoid arthritis of multiple sites with negative rheumatoid factor [facility])   [facility] [place], [place] W, NP   1 month ago Current severe episode of major depressive disorder without psychotic features without prior episode [facility])   [facility] Kathrine Haddock, NP   2 months ago Routine general medical examination at a health care facility   [place], NP   2 months ago Acute cystitis with hematuria   [place], DO   1 year ago Hx of food anaphylaxis   [facility], Lupita Raider, [place]       Future Appointments             In 5 days Ralene Bathe, MD [place]   In 10 months [place], Coralie Keens, NP [facility], [facility]

## 2020-09-18 NOTE — Telephone Encounter (Signed)
Unable to take Lamisil due to elevated liver enzymes. She can try FungiNail OTC

## 2020-09-20 ENCOUNTER — Encounter: Payer: Self-pay | Admitting: Dermatology

## 2020-09-20 ENCOUNTER — Other Ambulatory Visit: Payer: Self-pay

## 2020-09-20 ENCOUNTER — Ambulatory Visit: Payer: BC Managed Care – PPO | Admitting: Dermatology

## 2020-09-20 DIAGNOSIS — L820 Inflamed seborrheic keratosis: Secondary | ICD-10-CM

## 2020-09-20 DIAGNOSIS — L720 Epidermal cyst: Secondary | ICD-10-CM

## 2020-09-20 DIAGNOSIS — D1800 Hemangioma unspecified site: Secondary | ICD-10-CM

## 2020-09-20 DIAGNOSIS — L603 Nail dystrophy: Secondary | ICD-10-CM

## 2020-09-20 DIAGNOSIS — L814 Other melanin hyperpigmentation: Secondary | ICD-10-CM

## 2020-09-20 DIAGNOSIS — D239 Other benign neoplasm of skin, unspecified: Secondary | ICD-10-CM

## 2020-09-20 DIAGNOSIS — D229 Melanocytic nevi, unspecified: Secondary | ICD-10-CM

## 2020-09-20 DIAGNOSIS — Z86018 Personal history of other benign neoplasm: Secondary | ICD-10-CM

## 2020-09-20 DIAGNOSIS — L578 Other skin changes due to chronic exposure to nonionizing radiation: Secondary | ICD-10-CM

## 2020-09-20 DIAGNOSIS — D2372 Other benign neoplasm of skin of left lower limb, including hip: Secondary | ICD-10-CM | POA: Diagnosis not present

## 2020-09-20 DIAGNOSIS — L821 Other seborrheic keratosis: Secondary | ICD-10-CM

## 2020-09-20 DIAGNOSIS — Z1283 Encounter for screening for malignant neoplasm of skin: Secondary | ICD-10-CM

## 2020-09-20 MED ORDER — TAVABOROLE 5 % EX SOLN: CUTANEOUS | 2 refills | Status: DC

## 2020-09-20 NOTE — Progress Notes (Signed)
Follow-Up Visit   Subjective  Jamie Stanley is a 50 y.o. female who presents for the following: Annual Exam (Mole check ). Pt c/o growths on her back growing and changing.  The patient presents for Total-Body Skin Exam (TBSE) for skin cancer screening and mole check.  The following portions of the chart were reviewed this encounter and updated as appropriate:   Tobacco  Allergies  Meds  Problems  Med Hx  Surg Hx  Fam Hx     Review of Systems:  No other skin or systemic complaints except as noted in HPI or Assessment and Plan.  Objective  Well appearing patient in no apparent distress; mood and affect are within normal limits.  A full examination was performed including scalp, head, eyes, ears, nose, lips, neck, chest, axillae, abdomen, back, buttocks, bilateral upper extremities, bilateral lower extremities, hands, feet, fingers, toes, fingernails, and toenails. All findings within normal limits unless otherwise noted below.  Objective  Left Upper Back: Erythematous keratotic or waxy stuck-on papule or plaque.   Objective  Left infrascapular: 1.0 cm Subcutaneous nodule.   Objective  Left Lower Leg - Anterior: Firm pink/brown papulenodule with dimple sign.    Assessment & Plan  Inflamed seborrheic keratosis Left Upper Back  Destruction of lesion - Left Upper Back Complexity: simple   Destruction method: cryotherapy   Informed consent: discussed and consent obtained   Timeout:  patient name, date of birth, surgical site, and procedure verified Lesion destroyed using liquid nitrogen: Yes   Region frozen until ice ball extended beyond lesion: Yes   Outcome: patient tolerated procedure well with no complications   Post-procedure details: wound care instructions given    Epidermal inclusion cyst Left infrascapular  Hypertrophic scar vs cyst Benign-appearing.  Recommend observation.  Call clinic for changes.  Recommend daily use of broad spectrum spf 30+ sunscreen to  sun-exposed areas.    Dermatofibroma Left Lower Leg - Anterior  Benign-appearing.  Observation.  Call clinic for new or changing moles.  Recommend daily use of broad spectrum spf 30+ sunscreen to sun-exposed areas.    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 - Call for any changes  Seborrheic Keratoses - Stuck-on, waxy, tan-brown papules and/or plaques  - Benign-appearing - Discussed benign etiology and prognosis. - Observe - Call for any changes  Melanocytic Nevi - Tan-brown and/or pink-flesh-colored symmetric macules and papules - Benign appearing on exam today - Observation - Call clinic for new or changing moles - Recommend daily use of broad spectrum spf 30+ sunscreen to sun-exposed areas.   Hemangiomas - Red papules - Discussed benign nature - Observe - Call for any changes  Actinic Damage - Chronic condition, secondary to cumulative UV/sun exposure - diffuse scaly erythematous macules with underlying dyspigmentation - Recommend daily broad spectrum sunscreen SPF 30+ to sun-exposed areas, reapply every 2 hours as needed.  - Staying in the shade or wearing long sleeves, sun glasses (UVA+UVB protection) and wide brim hats (4-inch brim around the entire circumference of the hat) are also recommended for sun protection.  - Call for new or changing lesions.  History  of Dysplastic Nevi Left back infrascapular 7.0 cm lat to spine  - No evidence of recurrence today - Recommend regular full body skin exams - Recommend daily broad spectrum sunscreen SPF 30+ to  sun-exposed areas, reapply every 2 hours as needed.  - Call if any new or changing lesions are noted between office visits  Skin cancer screening performed today.  Return in about 1 year (around 09/20/2021) for TBSE, Hx of Dysplastic nevus .  IMarye Round, CMA, am acting as scribe for Sarina Ser, MD .  Documentation: I have reviewed the above documentation for accuracy and completeness, and I agree with the above.  Sarina Ser, MD

## 2020-09-20 NOTE — Patient Instructions (Addendum)
If you have any questions or concerns for your doctor, please call our main line at [phone] and press option 4 to reach your doctor's medical assistant. If no one answers, please leave a voicemail as directed and we will return your call as soon as possible. Messages left after 4 pm will be answered the following business day.   You may also send [place] a message via [place]. We typically respond to MyChart messages within 1-2 business days.  For prescription refills, please ask your pharmacy to contact our office. Our fax number is [phone].  If you have an urgent issue when the clinic is closed that cannot wait until the next business day, you can page your doctor at the number below.    Please note that while we do our best to be available for urgent issues outside of office hours, we are not available 24/7.   If you have an urgent issue and are unable to reach [place], you may choose to seek medical care at your doctor's office, retail clinic, urgent care center, or emergency room.  If you have a medical emergency, please immediately call 911 or go to the emergency department.  Pager Numbers  - Dr. Nehemiah Massed: [phone]  - Dr. Laurence Ferrari: [phone]  - Dr. Nicole Kindred: [phone]  In the event of inclement weather, please call our main line at [phone] for an update on the status of any delays or closures.  Dermatology Medication Tips: Please keep the boxes that topical medications come in in order to help keep track of the instructions about where and how to use these. Pharmacies typically print the medication instructions only on the boxes and not directly on the medication tubes.   If your medication is too expensive, please contact our office at [phone] option 4 or send [place] a message through [place].   We are unable to tell what your co-pay for medications will be in advance as this is different depending on your insurance coverage. However, we may be able to find a substitute  medication at lower cost or fill out paperwork to get insurance to cover a needed medication.   If a prior authorization is required to get your medication covered by your insurance company, please allow [place] 1-2 business days to complete this process.  Drug prices often vary depending on where the prescription is filled and some pharmacies may offer cheaper prices.  The website www.goodrx.com contains coupons for medications through different pharmacies. The prices here do not account for what the cost may be with help from insurance (it may be cheaper with your insurance), but the website can give you the price if you did not use any insurance.  - You can print the associated coupon and take it with your prescription to the pharmacy.  - You may also stop by our office during regular business hours and pick up a GoodRx coupon card.  - If you need your prescription sent electronically to a different pharmacy, notify our office through [facility] or by phone at [phone] option 4.  Start over the counter Kerasal night time wrap apply to nails Tuesday, Thursday, Saturday at bedtime Start [place] Mon apply to toenails  Monday, Wednesday, Friday

## 2020-09-25 ENCOUNTER — Encounter: Payer: Self-pay | Admitting: Dermatology

## 2020-11-16 ENCOUNTER — Telehealth: Payer: BC Managed Care – PPO | Admitting: Physician Assistant

## 2020-11-16 ENCOUNTER — Telehealth: Payer: Self-pay

## 2020-11-16 ENCOUNTER — Ambulatory Visit: Payer: Self-pay | Admitting: *Deleted

## 2020-11-16 DIAGNOSIS — J208 Acute bronchitis due to other specified organisms: Secondary | ICD-10-CM | POA: Diagnosis not present

## 2020-11-16 DIAGNOSIS — B9689 Other specified bacterial agents as the cause of diseases classified elsewhere: Secondary | ICD-10-CM | POA: Diagnosis not present

## 2020-11-16 MED ORDER — ALBUTEROL SULFATE HFA 108 (90 BASE) MCG/ACT IN AERS: 2.0000 | INHALATION_SPRAY | Freq: Four times a day (QID) | RESPIRATORY_TRACT | 0 refills | Status: DC | PRN

## 2020-11-16 MED ORDER — DOXYCYCLINE HYCLATE 100 MG PO TABS: 100.0000 mg | ORAL_TABLET | Freq: Two times a day (BID) | ORAL | 0 refills | Status: DC

## 2020-11-16 MED ORDER — BENZONATATE 100 MG PO CAPS
100.0000 mg | ORAL_CAPSULE | Freq: Three times a day (TID) | ORAL | 0 refills | Status: DC | PRN
Start: 2020-11-16 — End: 2021-04-17

## 2020-11-16 NOTE — Telephone Encounter (Signed)
Copied from Ken Caryl [phone]. Topic: Appointment Scheduling - Scheduling Inquiry for Clinic >> Nov 16, 2020 12:17 PM Oneta Rack wrote: Patient requesting acute appointment prior to Monday (patient traveling out of the country) patient experiencing - sinues and chest congestion, wheezing at night requesting inhaler, no fever  / negative COVID Test , please advise

## 2020-11-16 NOTE — Telephone Encounter (Signed)
Ok to offer virtual appt tomorrow with Dr. Raliegh Ip as I will not be in the office.

## 2020-11-16 NOTE — Progress Notes (Signed)
I have spent 5 minutes in review of e-visit questionnaire, review and updating patient chart, medical decision making and response to patient.   Merrick Feutz Cody Nyal Schachter, PA-C    

## 2020-11-16 NOTE — Progress Notes (Signed)

## 2020-11-16 NOTE — Telephone Encounter (Signed)
I called the patient to offer her an appt for tomorrow. She informed me that she already done a e-visit.

## 2020-11-16 NOTE — Telephone Encounter (Signed)
Calls with mildly productive cough-thick dark phlegm going on 3 weeks. Congestion and throat drainage. Fever last week none this week. Used Mucinex without much success. Going on vacay in 4 days she would like to be seen today. No availability until Monday. Recommended e-chart visit with [facility] today. Walked patient thru online portal. Call back if needed.    Reason for Disposition  Cough has been present for > 3 weeks  Answer Assessment - Initial Assessment Questions 1. ONSET: "When did the cough begin?"     About 3 weeks ago 2. SEVERITY: "How bad is the cough today?"      bothersome 3. SPUTUM: "Describe the color of your sputum" (none, dry cough; clear, white, yellow, green)     Thick and dark 4. HEMOPTYSIS: "Are you coughing up any blood?" If so ask: "How much?" (flecks, streaks, tablespoons, etc.)     no 5. DIFFICULTY BREATHING: "Are you having difficulty breathing?" If Yes, ask: "How bad is it?" (e.g., mild, moderate, severe)    - MILD: No SOB at rest, mild SOB with walking, speaks normally in sentences, can lie down, no retractions, pulse < 100.    - MODERATE: SOB at rest, SOB with minimal exertion and prefers to sit, cannot lie down flat, speaks in phrases, mild retractions, audible wheezing, pulse 100-120.    - SEVERE: Very SOB at rest, speaks in single words, struggling to breathe, sitting hunched forward, retractions, pulse > 120      mild 6. FEVER: "Do you have a fever?" If Yes, ask: "What is your temperature, how was it measured, and when did it start?"     Last week but none this week 7. CARDIAC HISTORY: "Do you have any history of heart disease?" (e.g., heart attack, congestive heart failure)      no 8. LUNG HISTORY: "Do you have any history of lung disease?"  (e.g., pulmonary embolus, asthma, emphysema)     no 9. PE RISK FACTORS: "Do you have a history of blood clots?" (or: recent major surgery, recent prolonged travel, bedridden)     no 10. OTHER SYMPTOMS: "Do you  have any other symptoms?" (e.g., runny nose, wheezing, chest pain)       Congestion, nasal drainage in throat 11. PREGNANCY: "Is there any chance you are pregnant?" "When was your last menstrual period?"       na 12. TRAVEL: "Have you traveled out of the country in the last month?" (e.g., travel history, exposures)       no  Protocols used: Cough - Acute Productive-A-AH

## 2021-04-16 NOTE — Progress Notes (Signed)
PCP:  Jearld Fenton, NP   Chief Complaint  Patient presents with   Gynecologic Exam    No concerns     HPI:      [name] is a 50 y.o. [phone] who LMP was No LMP recorded. Patient is perimenopausal., presents today for her annual examination.  Her menses are absent since 9/20 on OCPs. No PMB, rare vasomotor sx.   Sex activity: single partner, contraception -post menopausal status. Pt has vag dryness, using lubricants with relief.  Last Pap: 05/14/17  Results were: no abnormalities /neg HPV DNA  Hx of STDs: none  Last mammogram: 09/30/19 Results were: normal--routine follow-up in 12 months There is a FH of breast cancer in her mom, mat grt aunt and pat grt aunt.  There is a FH of ovarian cancer in her MGM. Pt is My Risk neg 2016, her mom is BRCA neg 2015. IBIS=33%. The patient does do self-breast exams. Is taking Vit D supp. Has not had breast MRI; declined in past.   Tobacco use: The patient denies current or previous tobacco use. Alcohol use: rare No drug use.  Exercise: not active  She does get adequate calcium and Vitamin D in her diet.  Labs/hyperlipidemia with PCP now. Diagnosed with RA after moderna  vaccine. Did methotrexate short term but managing with NSAIDs.  Colonoscopy: Neg cologuard 2021, repeat after 3 yrs.    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   Physiological ovarian cysts    Rheumatoid arthritis ([place])    Screening for colon cancer 2021   Cologuard neg; repeat after 3 yrs    Past Surgical History:  Procedure Laterality Date   APPENDECTOMY     BIOPSY BREAST     left    BREAST EXCISIONAL BIOPSY Left 2012    benign   INGUINAL HERNIA REPAIR      Family History  Problem Relation Age of Onset   Hypertension Mother    Hyperlipidemia Mother    Breast cancer Mother 27       ER/PR type BRCA Neg   AAA (abdominal aortic aneurysm) Mother    Hypothyroidism Mother    Hypertension Father    Hyperlipidemia Father    Glaucoma Father    Heart disease Paternal Grandfather 79   Diabetes Paternal Grandfather    Ovarian cancer Maternal Grandmother 21   Hypothyroidism Maternal Grandmother    Hypothyroidism Sister    Hypothyroidism Paternal Grandmother    Diabetes Paternal Grandmother    Breast cancer Other 15   Breast cancer Other 3   Hypothyroidism Maternal Aunt     Social History   Socioeconomic History   Marital status: Married    Spouse name: Not on file   Number of children: Not on file   Years of education: Not on file   Highest education level: Not on file  Occupational History   Not on file  Tobacco Use   Smoking status: Never   Smokeless tobacco: Never  Vaping Use   Vaping Use: Never used  Substance and  Sexual Activity   Alcohol use: Not Currently    Comment: occasional   Drug use: Never   Sexual activity: Yes    Birth control/protection: None  Other Topics Concern   Not on file  Social History Narrative   Not on file   Social Determinants of Health   Financial Resource Strain: Not on file  Food Insecurity: Not on file  Transportation Needs: Not on file  Physical Activity: Not on file  Stress: Not on file  Social Connections: Not on file  Intimate Partner Violence: Not on file    Current Meds  Medication Sig   acetaminophen (TYLENOL) 325 MG tablet Take 650 mg by mouth every 6 (six) hours as needed.   albuterol (VENTOLIN HFA) 108 (90 Base) MCG/ACT inhaler Inhale 2 puffs into the lungs every 6 (six) hours as needed for wheezing or shortness of breath.   cyclobenzaprine (FLEXERIL) 10 MG tablet Take by mouth as needed.   Efinaconazole 10 % SOLN Apply 1 application  topically daily.   escitalopram (LEXAPRO) 10 MG tablet Take 1 tablet (10 mg total) by mouth daily.   loratadine (CLARITIN) 10 MG tablet Take 10 mg by mouth daily.   meloxicam (MOBIC) 7.5 MG tablet Take 7.5 mg by mouth as needed.   Tavaborole (KERYDIN) 5 % SOLN Apply to toenail Monday, Wednesday, Friday at bedtime     ROS:  Review of Systems  Constitutional:  Negative for fatigue, fever and unexpected weight change.  Respiratory:  Negative for cough, shortness of breath and wheezing.   Cardiovascular:  Negative for chest pain, palpitations and leg swelling.  Gastrointestinal:  Negative for blood in stool, constipation, diarrhea, nausea and vomiting.  Endocrine: Negative for cold intolerance, heat intolerance and polyuria.  Genitourinary:  Negative for dyspareunia, dysuria, flank pain, frequency, genital sores, hematuria, menstrual problem, pelvic pain, urgency, vaginal bleeding, vaginal discharge and vaginal pain.  Musculoskeletal:  Positive for arthralgias, joint swelling and myalgias. Negative for back pain.  Skin:  Negative for rash.  Neurological:  Negative for dizziness, syncope, light-headedness, numbness and headaches.  Hematological:  Negative for adenopathy.  Psychiatric/Behavioral:  Negative for agitation, confusion, sleep disturbance and suicidal ideas. The patient is not nervous/anxious.     Objective: BP 140/70    Ht 5' 3"  (1.6 m)    Wt 155 lb (70.3 kg)    BMI 27.46 kg/m    Physical Exam Constitutional:      Appearance: She is well-developed.  Genitourinary:     Vulva and rectum normal.     Right Labia: No rash, tenderness or lesions.    Left Labia: No tenderness, lesions or rash.    No vaginal discharge, erythema or tenderness.      Right Adnexa: not tender and no mass present.    Left Adnexa: not tender and no mass present.    No cervical friability or polyp.     Uterus is not enlarged or tender.  Rectum:     Guaiac result negative.     No tenderness.   Breasts:    Right: No mass, nipple discharge, skin change or tenderness.     Left: No mass, nipple discharge, skin change or tenderness.  Neck:     Thyroid: No thyromegaly.  Cardiovascular:     Rate and Rhythm: Normal rate and regular rhythm.     Heart sounds: Normal heart sounds. No murmur heard. Pulmonary:     Effort: Pulmonary effort is normal.     Breath sounds: Normal breath  sounds.  Abdominal:     Palpations: Abdomen is soft.     Tenderness: There is no abdominal tenderness. There is no guarding or rebound.  Musculoskeletal:        General: Normal range of motion.     Cervical back: Normal range of motion.  Lymphadenopathy:     Cervical: No cervical adenopathy.  Neurological:     General: No focal deficit present.     Mental Status: She is alert and oriented to person, place, and time.     Cranial Nerves: No cranial nerve deficit.  Skin:    General: Skin is warm and dry.  Psychiatric:        Mood and Affect: Mood normal.        Behavior: Behavior normal.        Thought Content: Thought content normal.        Judgment: Judgment normal.  Vitals reviewed.   Assessment/Plan: Encounter for annual routine gynecological examination  Encounter for screening mammogram for malignant neoplasm of breast - Plan: MM 3D SCREEN BREAST BILATERAL; pt to sheds mammo  Family history of breast cancer - Plan: MM 3D SCREEN BREAST BILATERAL; Pt is MyRisk neg.  Increased risk of breast cancer - Plan: MM 3D SCREEN BREAST BILATERAL; Cont monthly SBE, yearly CBE and mammos, Cont Vit D. Pt to call after mammo to sched scr breast MRI for this yr if desires.         GYN counsel breast self exam, mammography screening, adequate intake of calcium and vitamin D, diet and exercise     F/U  Return in about 1 year (around 04/17/2022).  Jamie Stanley B. Costas Sena, PA-C 04/17/2021 10:10 AM

## 2021-04-17 ENCOUNTER — Ambulatory Visit (INDEPENDENT_AMBULATORY_CARE_PROVIDER_SITE_OTHER): Payer: BC Managed Care – PPO | Admitting: Obstetrics and Gynecology

## 2021-04-17 ENCOUNTER — Encounter: Payer: Self-pay | Admitting: Obstetrics and Gynecology

## 2021-04-17 ENCOUNTER — Other Ambulatory Visit: Payer: Self-pay

## 2021-04-17 VITALS — BP 140/70 | Ht 63.0 in | Wt 155.0 lb

## 2021-04-17 DIAGNOSIS — Z01419 Encounter for gynecological examination (general) (routine) without abnormal findings: Secondary | ICD-10-CM | POA: Diagnosis not present

## 2021-04-17 DIAGNOSIS — Z1231 Encounter for screening mammogram for malignant neoplasm of breast: Secondary | ICD-10-CM | POA: Diagnosis not present

## 2021-04-17 DIAGNOSIS — Z803 Family history of malignant neoplasm of breast: Secondary | ICD-10-CM

## 2021-04-17 DIAGNOSIS — Z9189 Other specified personal risk factors, not elsewhere classified: Secondary | ICD-10-CM

## 2021-04-17 NOTE — Patient Instructions (Signed)
I value your feedback and you entrusting us with your care. If you get a [facility] patient survey, I would appreciate you taking the time to let us know about your experience today. Thank you!  [facility]: [phone]      

## 2021-06-03 ENCOUNTER — Encounter: Payer: Self-pay | Admitting: Internal Medicine

## 2021-06-04 MED ORDER — ESCITALOPRAM OXALATE 10 MG PO TABS: 10.0000 mg | ORAL_TABLET | Freq: Every day | ORAL | 0 refills | Status: DC

## 2021-06-08 ENCOUNTER — Other Ambulatory Visit: Payer: Self-pay

## 2021-06-08 ENCOUNTER — Ambulatory Visit
Admission: RE | Admit: 2021-06-08 | Discharge: 2021-06-08 | Disposition: A | Discharge status: Home / Self Care | Payer: BC Managed Care – PPO | Source: Ambulatory Visit | Attending: Obstetrics and Gynecology | Admitting: Obstetrics and Gynecology

## 2021-06-08 DIAGNOSIS — Z803 Family history of malignant neoplasm of breast: Secondary | ICD-10-CM | POA: Diagnosis present

## 2021-06-08 DIAGNOSIS — Z1231 Encounter for screening mammogram for malignant neoplasm of breast: Secondary | ICD-10-CM | POA: Insufficient documentation

## 2021-06-08 DIAGNOSIS — Z9189 Other specified personal risk factors, not elsewhere classified: Secondary | ICD-10-CM | POA: Insufficient documentation

## 2021-07-19 ENCOUNTER — Encounter: Payer: BC Managed Care – PPO | Admitting: Internal Medicine

## 2021-08-01 ENCOUNTER — Ambulatory Visit (INDEPENDENT_AMBULATORY_CARE_PROVIDER_SITE_OTHER): Payer: BC Managed Care – PPO | Admitting: Internal Medicine

## 2021-08-01 ENCOUNTER — Encounter: Payer: Self-pay | Admitting: Internal Medicine

## 2021-08-01 VITALS — BP 126/88 | HR 71 | Temp 97.1°F | Ht 62.0 in | Wt 160.0 lb

## 2021-08-01 DIAGNOSIS — Z0001 Encounter for general adult medical examination with abnormal findings: Secondary | ICD-10-CM | POA: Diagnosis not present

## 2021-08-01 DIAGNOSIS — E663 Overweight: Secondary | ICD-10-CM | POA: Insufficient documentation

## 2021-08-01 DIAGNOSIS — Z6829 Body mass index (BMI) 29.0-29.9, adult: Secondary | ICD-10-CM

## 2021-08-01 DIAGNOSIS — E7800 Pure hypercholesterolemia, unspecified: Secondary | ICD-10-CM

## 2021-08-01 MED ORDER — ESCITALOPRAM OXALATE 10 MG PO TABS: 10.0000 mg | ORAL_TABLET | Freq: Every day | ORAL | 1 refills | Status: DC

## 2021-08-01 MED ORDER — MELOXICAM 7.5 MG PO TABS
7.5000 mg | ORAL_TABLET | ORAL | 1 refills | Status: DC | PRN
Start: 2021-08-01 — End: 2022-02-14

## 2021-08-01 NOTE — Patient Instructions (Signed)

## 2021-08-01 NOTE — Progress Notes (Signed)
? ?Subjective:  ? ? Patient ID: Jamie Stanley, female    DOB: [birth year], 51 y.o.   MRN: [id number] ? ?HPI ? ?Pt presents to the clinic today for her annual exam. ? ?Flu: 01/2021 ?Tetanus: 12/2016 ?Covid: Pfizer x 2, Moderna x 1 ?Shingrix: never ?Pap smear: 04/2017 ?Mammogram: 05/2021 ?Colon screening: 06/2019, Cologuard ?Vision screening: annually ?Dentist: biannually ? ?Diet: She does eat meat. She consumes fruits and veggies. She occassionally eats fried foods. She drinks mostly water. ?Exercise: Walking ? ?Review of Systems ? ?   ?Past Medical History:  ?Diagnosis Date  ? Allergy   ? Asthma   ? BRCA negative 2016  ? MyRisk neg  ? Dysplastic nevus 12/10/2017  ? L back infrascapular 7.0 cm lat to spine - mild   ? Family history of breast cancer   ? Family history of ovarian cancer   ? Frequent headaches   ? Hyperlipidemia   ? Increased risk of breast cancer 2016  ? IBIS=33%  ? 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 Physiological ovarian cysts   ? Rheumatoid arthritis ([place])   ? Screening for colon cancer 2021  ? Cologuard neg; repeat after 3 yrs  ? ? ?Current Outpatient Medications  ?Medication Sig Dispense Refill  ? acetaminophen (TYLENOL) 325 MG tablet Take 650 mg by mouth every 6 (six) hours as needed.    ? albuterol (VENTOLIN HFA) 108 (90 Base) MCG/ACT inhaler Inhale 2 puffs into the lungs every 6 (six) hours as needed for wheezing or shortness of breath. 8 g 0  ? cyclobenzaprine (FLEXERIL) 10 MG tablet Take by mouth as needed.    ? Efinaconazole 10 % SOLN Apply 1 application topically daily. 8 mL 2  ? EPINEPHrine 0.3 mg/0.3 mL IJ SOAJ injection Inject 0.3 mLs (0.3 mg total) into the muscle as needed for anaphylaxis. (Patient not taking: Reported on 04/17/2021) 2 each 1  ? escitalopram (LEXAPRO) 10 MG tablet Take 1 tablet (10 mg total) by mouth daily. 90 tablet 0  ? loratadine (CLARITIN) 10 MG tablet Take 10 mg by mouth daily.    ? meloxicam (MOBIC) 7.5 MG tablet Take 7.5 mg by mouth as needed.    ? Tavaborole (KERYDIN) 5 %  SOLN Apply to toenail Monday, Wednesday, Friday at bedtime 4 mL 2  ? ?No current facility-administered medications for this visit.  ? ? ?Allergies  ?Allergen Reactions  ? Cinnamon Anaphylaxis  ? Penicillins Rash  ?  Rash ?  ? Demerol [Meperidine] Nausea Only  ?  Nausea  ?  ? ? ?Family History  ?Problem Relation Age of Onset  ? Hypertension Mother   ? Hyperlipidemia Mother   ? Breast cancer Mother 10  ?     ER/PR type BRCA Neg  ? AAA (abdominal aortic aneurysm) Mother   ? Hypothyroidism Mother   ? Hypertension Father   ? Hyperlipidemia Father   ? Glaucoma Father   ? Heart disease Paternal Grandfather 13  ? Diabetes Paternal Grandfather   ? Ovarian cancer Maternal Grandmother 53  ? Hypothyroidism Maternal Grandmother   ? Hypothyroidism Sister   ? Hypothyroidism Paternal Grandmother   ? Diabetes Paternal Grandmother   ? Breast cancer Other 97  ? Breast cancer Other 93  ? Hypothyroidism Maternal Aunt   ? ? ?Social History  ? ?Socioeconomic History  ? Marital status: Married  ?  Spouse name: Not on file  ? Number of children: Not on file  ? Years of education: Not on file  ? Highest education  level: Not on file  ?Occupational History  ? Not on file  ?Tobacco Use  ? Smoking status: Never  ? Smokeless tobacco: Never  ?Vaping Use  ? Vaping Use: Never used  ?Substance and Sexual Activity  ? Alcohol use: Not Currently  ?  Comment: occasional  ? Drug use: Never  ? Sexual activity: Yes  ?  Birth control/protection: None  ?Other Topics Concern  ? Not on file  ?Social History Narrative  ? Not on file  ? ?Social Determinants of Health  ? ?Financial Resource Strain: Not on file  ?Food Insecurity: Not on file  ?Transportation Needs: Not on file  ?Physical Activity: Not on file  ?Stress: Not on file  ?Social Connections: Not on file  ?Intimate Partner Violence: Not on file  ? ? ? ?Constitutional: Denies fever, malaise, fatigue, headache or abrupt weight changes.  ?HEENT: Denies eye pain, eye redness, ear pain, ringing in the ears,  wax buildup, runny nose, nasal congestion, bloody nose, or sore throat. ?Respiratory: Denies difficulty breathing, shortness of breath, cough or sputum production.   ?Cardiovascular: Denies chest pain, chest tightness, palpitations or swelling in the hands or feet.  ?Gastrointestinal: pt reports intermittent reflux. Denies abdominal pain, bloating, constipation, diarrhea or blood in the stool.  ?GU: Denies urgency, frequency, pain with urination, burning sensation, blood in urine, odor or discharge. ?Musculoskeletal: Pt reports joint pain. Denies decrease in range of motion, difficulty with gait, muscle pain or joint swelling.  ?Skin: Denies redness, rashes, lesions or ulcercations.  ?Neurological: Denies dizziness, difficulty with memory, difficulty with speech or problems with balance and coordination.  ?Psych: Pt has a history of anxiety. Denies depression, SI/HI. ? ?No other specific complaints in a complete review of systems (except as listed in HPI above). ? ?Objective:  ? Physical Exam ? ? ?BP 126/88 (BP Location: Left Arm, Patient Position: Sitting, Cuff Size: Normal)   Pulse 71   Temp (!) 97.1 ?F (36.2 ?C) (Temporal)   Ht _0  (1.575 m)   Wt 160 lb (72.6 kg)   SpO2 96%   BMI 29.26 kg/m?  ? ?Wt Readings from Last 3 Encounters:  ?04/17/21 155 lb (70.3 kg)  ?09/15/20 144 lb (65.3 kg)  ?07/25/20 142 lb 6.4 oz (64.6 kg)  ? ? ?General: Appears her stated age, overweight, in NAD. ?Skin: Warm, dry and intact.  ?HEENT: Head: normal shape and size; Eyes: sclera white and EOMs intact;  ?Neck:  Neck supple, trachea midline. No masses, lumps or thyromegaly present.  ?Cardiovascular: Normal rate and rhythm. S1,S2 noted.  No murmur, rubs or gallops noted. No JVD or BLE edema. No carotid bruits noted. ?Pulmonary/Chest: Normal effort and positive vesicular breath sounds. No respiratory distress. No wheezes, rales or ronchi noted.  ?Abdomen: Soft and nontender.  ?Musculoskeletal: Strength 5/5 BUE/BLE. No difficulty  with gait.  ?Neurological: Alert and oriented. Cranial nerves II-XII grossly intact. Coordination normal.  ?Psychiatric: Mood and affect normal. Behavior is normal. Judgment and thought content normal.  ? ? ?BMET ?   ?Component Value Date/Time  ? NA 141 07/11/2020 1102  ? NA 140 07/02/2019 0000  ? K 3.9 07/11/2020 1102  ? CL 106 07/11/2020 1102  ? CO2 27 07/11/2020 1102  ? GLUCOSE 92 07/11/2020 1102  ? BUN 12 07/11/2020 1102  ? BUN 11 07/02/2019 0000  ? CREATININE 0.80 07/11/2020 1102  ? CALCIUM 9.2 07/11/2020 1102  ? GFRNONAA 83 07/02/2019 0000  ? GFRNONAA 69 01/06/2019 1151  ? GFRAA 96 07/02/2019 0000  ?  GFRAA 80 01/06/2019 1151  ? ? ?Lipid Panel  ?   ?Component Value Date/Time  ? CHOL 204 (H) 07/11/2020 1102  ? CHOL 188 07/02/2019 0000  ? TRIG 57 07/11/2020 1102  ? HDL 78 07/11/2020 1102  ? HDL 61 07/02/2019 0000  ? CHOLHDL 2.6 07/11/2020 1102  ? LDLCALC 112 (H) 07/11/2020 1102  ? ? ?CBC ?   ?Component Value Date/Time  ? WBC 5.5 07/11/2020 1102  ? RBC 4.30 07/11/2020 1102  ? HGB 13.0 07/11/2020 1102  ? HGB 13.9 07/02/2019 0000  ? HCT 39.2 07/11/2020 1102  ? HCT 41.7 07/02/2019 0000  ? PLT 184 07/11/2020 1102  ? PLT 214 07/02/2019 0000  ? MCV 91.2 07/11/2020 1102  ? MCV 92 07/02/2019 0000  ? MCH 30.2 07/11/2020 1102  ? MCHC 33.2 07/11/2020 1102  ? RDW 12.1 07/11/2020 1102  ? RDW 12.3 07/02/2019 0000  ? LYMPHSABS 2,503 07/11/2020 1102  ? LYMPHSABS 3.0 07/02/2019 0000  ? EOSABS 143 07/11/2020 1102  ? EOSABS 0.1 07/02/2019 0000  ? BASOSABS 22 07/11/2020 1102  ? BASOSABS 0.0 07/02/2019 0000  ? ? ?Hgb A1C ?No results found for: HGBA1C ? ? ? ? ? ?   ?Assessment & Plan:  ? ?Preventative Health Maintenance: ? ?Encouraged her to get a flu shot in the fall ?Tetanus UTD ?Encouraged her to get her covid booster ?Discussed Shingrix vaccine, she will check coverage with her insurance company ?Pap smear UTD ?Mammogram UTD ?Colon screening UTD ?Encouraged her to consume a balanced diet and exercise regimen ?Advised her to see an  eye doctor and dentist annually ?Will check CBC, CMET, Lipid and A1C ? ?RTC in 6 months follow up chronic conditions ? ?Webb Silversmith, NP ? ?

## 2021-08-01 NOTE — Assessment & Plan Note (Signed)
Encouraged diet and exercise for weight loss ?

## 2021-08-02 LAB — LIPID PANEL
Cholesterol: 255 mg/dL — ABNORMAL HIGH (ref <200)
HDL: 68 mg/dL (ref >=50)
LDL Cholesterol (Calc): 168 mg/dL (calc) — ABNORMAL HIGH
Non-HDL Cholesterol (Calc): 187 mg/dL (calc) — ABNORMAL HIGH (ref <130)
Total CHOL/HDL Ratio: 3.8 (calc) (ref <5.0)
Triglycerides: 83 mg/dL (ref <150)

## 2021-08-02 LAB — COMPLETE METABOLIC PANEL WITH GFR
AG Ratio: 1.8 (calc) (ref 1.0–2.5)
ALT: 29 U/L (ref 6–29)
AST: 26 U/L (ref 10–35)
Albumin: 4.3 g/dL (ref 3.6–5.1)
Alkaline phosphatase (APISO): 87 U/L (ref 37–153)
BUN: 14 mg/dL (ref 7–25)
CO2: 28 mmol/L (ref 20–32)
Calcium: 9.2 mg/dL (ref 8.6–10.4)
Chloride: 104 mmol/L (ref 98–110)
Creat: 0.83 mg/dL (ref 0.50–1.03)
Globulin: 2.4 g/dL (calc) (ref 1.9–3.7)
Glucose, Bld: 90 mg/dL (ref 65–99)
Potassium: 4 mmol/L (ref 3.5–5.3)
Sodium: 140 mmol/L (ref 135–146)
Total Bilirubin: 0.6 mg/dL (ref 0.2–1.2)
Total Protein: 6.7 g/dL (ref 6.1–8.1)
eGFR: 84 mL/min/{1.73_m2} (ref >=60)

## 2021-08-02 LAB — CBC
HCT: 38.2 % (ref 35.0–45.0)
Hemoglobin: 12.7 g/dL (ref 11.7–15.5)
MCH: 30.1 pg (ref 27.0–33.0)
MCHC: 33.2 g/dL (ref 32.0–36.0)
MCV: 90.5 fL (ref 80.0–100.0)
MPV: 10.2 fL (ref 7.5–12.5)
Platelets: 182 10*3/uL (ref 140–400)
RBC: 4.22 10*6/uL (ref 3.80–5.10)
RDW: 12.7 % (ref 11.0–15.0)
WBC: 5.4 10*3/uL (ref 3.8–10.8)

## 2021-08-02 LAB — HEMOGLOBIN A1C
Hgb A1c MFr Bld: 5.4 % of total Hgb (ref <5.7)
Mean Plasma Glucose: 108 mg/dL
eAG (mmol/L): 6 mmol/L

## 2021-08-02 NOTE — Addendum Note (Signed)
Addended by: Jearld Fenton on: 08/02/2021 02:21 PM ? ? Modules accepted: Orders ? ?

## 2021-09-20 ENCOUNTER — Ambulatory Visit: Payer: BC Managed Care – PPO | Admitting: Dermatology

## 2021-09-20 DIAGNOSIS — L821 Other seborrheic keratosis: Secondary | ICD-10-CM

## 2021-09-20 DIAGNOSIS — Z86018 Personal history of other benign neoplasm: Secondary | ICD-10-CM | POA: Diagnosis not present

## 2021-09-20 DIAGNOSIS — L720 Epidermal cyst: Secondary | ICD-10-CM

## 2021-09-20 DIAGNOSIS — D1800 Hemangioma unspecified site: Secondary | ICD-10-CM

## 2021-09-20 DIAGNOSIS — B351 Tinea unguium: Secondary | ICD-10-CM

## 2021-09-20 DIAGNOSIS — B070 Plantar wart: Secondary | ICD-10-CM

## 2021-09-20 DIAGNOSIS — Z1283 Encounter for screening for malignant neoplasm of skin: Secondary | ICD-10-CM | POA: Diagnosis not present

## 2021-09-20 DIAGNOSIS — L603 Nail dystrophy: Secondary | ICD-10-CM

## 2021-09-20 DIAGNOSIS — L814 Other melanin hyperpigmentation: Secondary | ICD-10-CM

## 2021-09-20 DIAGNOSIS — D229 Melanocytic nevi, unspecified: Secondary | ICD-10-CM

## 2021-09-20 DIAGNOSIS — L578 Other skin changes due to chronic exposure to nonionizing radiation: Secondary | ICD-10-CM

## 2021-09-20 MED ORDER — TAVABOROLE 5 % EX SOLN: CUTANEOUS | 2 refills | Status: DC

## 2021-09-20 NOTE — Patient Instructions (Addendum)
Instructions for [facility] Medications  One or more of your medications was sent to the [facility] mail order compounding pharmacy. You will receive an email from them and can purchase the medicine through that link. It will then be mailed to your home at the address you confirmed. If for any reason you do not receive an email from them, please check your spam folder. If you still do not find the email, please let us know. [facility] phone number is [phone].     If You Need Anything After Your Visit  If you have any questions or concerns for your doctor, please call our main line at [phone] and press option 4 to reach your doctor's medical assistant. If no one answers, please leave a voicemail as directed and we will return your call as soon as possible. Messages left after 4 pm will be answered the following business day.   You may also send [place] a message via [place]. We typically respond to MyChart messages within 1-2 business days.  For prescription refills, please ask your pharmacy to contact our office. Our fax number is [phone].  If you have an urgent issue when the clinic is closed that cannot wait until the next business day, you can page your doctor at the number below.    Please note that while we do our best to be available for urgent issues outside of office hours, we are not available 24/7.   If you have an urgent issue and are unable to reach [place], you may choose to seek medical care at your doctor's office, retail clinic, urgent care center, or emergency room.  If you have a medical emergency, please immediately call 911 or go to the emergency department.  Pager Numbers  - Dr. Nehemiah Massed: [phone]  - Dr. Laurence Ferrari: [phone]  - Dr. Nicole Kindred: [phone]  In the event of inclement weather, please call our main line at [phone] for an update on the status of any delays or closures.  Dermatology Medication Tips: Please keep the boxes that  topical medications come in in order to help keep track of the instructions about where and how to use these. Pharmacies typically print the medication instructions only on the boxes and not directly on the medication tubes.   If your medication is too expensive, please contact our office at [phone] option 4 or send [place] a message through [place].   We are unable to tell what your co-pay for medications will be in advance as this is different depending on your insurance coverage. However, we may be able to find a substitute medication at lower cost or fill out paperwork to get insurance to cover a needed medication.   If a prior authorization is required to get your medication covered by your insurance company, please allow [place] 1-2 business days to complete this process.  Drug prices often vary depending on where the prescription is filled and some pharmacies may offer cheaper prices.  The website www.goodrx.com contains coupons for medications through different pharmacies. The prices here do not account for what the cost may be with help from insurance (it may be cheaper with your insurance), but the website can give you the price if you did not use any insurance.  - You can print the associated coupon and take it with your prescription to the pharmacy.  - You may also stop by our office during regular business hours and pick up a GoodRx coupon card.  - If you need your  prescription sent electronically to a different pharmacy, notify our office through [facility] or by phone at [phone] option 4.     Si Usted Necesita Algo Despus de Su Visita  Tambin puede enviarnos un mensaje a travs de Pharmacist, community. Por lo general respondemos a los mensajes de MyChart en el transcurso de 1 a 2 das hbiles.  Para renovar recetas, por favor pida a su farmacia que se ponga en contacto con nuestra oficina. Harland Dingwall de fax es [place] [phone].  Si tiene un asunto urgente cuando la clnica  est cerrada y que no puede esperar hasta el siguiente da hbil, puede llamar/localizar a su doctor(a) al nmero que aparece a continuacin.   Por favor, tenga en cuenta que aunque hacemos todo lo posible para estar disponibles para asuntos urgentes fuera del horario de [place], no estamos disponibles las 24 horas del da, los 7 das de la [place].   Si tiene un problema urgente y no puede comunicarse con nosotros, puede optar por buscar atencin mdica  en el consultorio de su doctor(a), en una clnica privada, en un centro de atencin urgente o en una sala de emergencias.  Si tiene Engineering geologist, por favor llame inmediatamente al 911 o vaya a la sala de emergencias.  Nmeros de bper  - Dr. Nehemiah Massed: [phone]  - Dra. Moye: [phone]  - Dra. Nicole Kindred: [phone]  En caso de inclemencias del [place], por favor llame a Johnsie Kindred principal al [phone] para una actualizacin sobre el [place] de cualquier retraso o cierre.  Consejos para la medicacin en dermatologa: Por favor, guarde las cajas en las que vienen los medicamentos de uso tpico para ayudarle a seguir las instrucciones sobre dnde y cmo usarlos. Las farmacias generalmente imprimen las instrucciones del medicamento slo en las cajas y no directamente en los tubos del [place].   Si su medicamento es muy caro, por favor, pngase en contacto con Zigmund Daniel llamando al [phone] y presione la opcin 4 o envenos un mensaje a travs de Pharmacist, community.   No podemos decirle cul ser su copago por los medicamentos por adelantado ya que esto es diferente dependiendo de la cobertura de su seguro. Sin embargo, es posible que podamos encontrar un medicamento sustituto a Electrical engineer un formulario para que el seguro cubra el medicamento que se considera necesario.   Si se requiere una autorizacin previa para que su compaa de seguros [place] su medicamento, por favor permtanos de 1 a 2 das hbiles para  completar este proceso.  Los precios de los medicamentos varan con frecuencia dependiendo del Environmental consultant de dnde se surte la receta y alguna farmacias pueden ofrecer precios ms baratos.  El sitio web www.goodrx.com tiene cupones para medicamentos de Airline pilot. Los precios aqu no tienen en cuenta lo que podra costar con la ayuda del seguro (puede ser ms barato con su seguro), pero el sitio web puede darle el precio si no utiliz Research scientist (physical sciences).  - Puede imprimir el cupn correspondiente y llevarlo con su receta a la farmacia.  - Tambin puede pasar por nuestra oficina durante el horario de atencin regular y Charity fundraiser una tarjeta de cupones de GoodRx.  - Si necesita que su receta se enve electrnicamente a una farmacia diferente, informe a nuestra oficina a travs de MyChart de [place] o por telfono llamando al [phone] y presione la opcin 4.

## 2021-09-20 NOTE — Progress Notes (Signed)
Follow-Up Visit   Subjective  Jamie Stanley is a 51 y.o. female who presents for the following: Annual Exam (History of dysplastic nevus - The patient presents for Total-Body Skin Exam (TBSE) for skin cancer screening and mole check.  The patient has spots, moles and lesions to be evaluated, some may be new or changing and the patient has concerns that these could be cancer./).  The following portions of the chart were reviewed this encounter and updated as appropriate:   Tobacco  Allergies  Meds  Problems  Med Hx  Surg Hx  Fam Hx     Review of Systems:  No other skin or systemic complaints except as noted in HPI or Assessment and Plan.  Objective  Well appearing patient in no apparent distress; mood and affect are within normal limits.  A full examination was performed including scalp, head, eyes, ears, nose, lips, neck, chest, axillae, abdomen, back, buttocks, bilateral upper extremities, bilateral lower extremities, hands, feet, fingers, toes, fingernails, and toenails. All findings within normal limits unless otherwise noted below.  Left infrascapular 0.6 cm pink soft papule        Assessment & Plan   History of Dysplastic Nevi - No evidence of recurrence today - Recommend regular full body skin exams - Recommend daily broad spectrum sunscreen SPF 30+ to sun-exposed areas, reapply every 2 hours as needed.  - Call if any new or changing lesions are noted between office visits  Lentigines - Scattered tan macules - Due to sun exposure - Benign-appearing, observe - Recommend daily broad spectrum sunscreen SPF 30+ to sun-exposed areas, reapply every 2 hours as needed. - Call for any changes  Seborrheic Keratoses - Stuck-on, waxy, tan-brown papules and/or plaques  - Benign-appearing - Discussed benign etiology and prognosis. - Observe - Call for any changes  Melanocytic Nevi - Tan-brown and/or pink-flesh-colored symmetric macules and papules - Benign appearing on  exam today - Observation - Call clinic for new or changing moles - Recommend daily use of broad spectrum spf 30+ sunscreen to sun-exposed areas.   Hemangiomas - Red papules - Discussed benign nature - Observe - Call for any changes -Discussed the treatment option of BBL/laser.  Typically we recommend 1-3 treatment sessions about 5-8 weeks apart for best results.  The patient's condition may require "maintenance treatments" in the future.  The fee for BBL / laser treatments is $350 per treatment session for the whole face.  A fee can be quoted for other parts of the body. Insurance typically does not pay for BBL/laser treatments and therefore the fee is an out-of-pocket cost.  Actinic Damage - Chronic condition, secondary to cumulative UV/sun exposure - diffuse scaly erythematous macules with underlying dyspigmentation - Recommend daily broad spectrum sunscreen SPF 30+ to sun-exposed areas, reapply every 2 hours as needed.  - Staying in the shade or wearing long sleeves, sun glasses (UVA+UVB protection) and wide brim hats (4-inch brim around the entire circumference of the hat) are also recommended for sun protection.  - Call for new or changing lesions.  Skin cancer screening performed today.  Epidermal inclusion cyst vs Scar Left infrascapular Benign appearing, observe. See photo. Benign-appearing. Exam most consistent with an epidermal inclusion cyst vs scar. Discussed that a cyst is a benign growth that can grow over time and sometimes get irritated or inflamed. Recommend observation if it is not bothersome. Discussed option of surgical excision to remove it if it is growing, symptomatic, or other changes noted. Please call for new  or changing lesions so they can be evaluated.  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 Condition is symptomatic / bothersome to patient. Not to goal.  Continue Kerydin  solution qhs Monday, Wednesday and Friday Related Medications Tavaborole (KERYDIN) 5 % SOLN Apply to toenail Monday, Wednesday, Friday at bedtime  Plantar wart Right plantar foot Start prescription 5-fluorouracil/salicylic acid wart paste from [facility] nightly under occlusion. Reviewed risk of irritation and risk scarring if applied to normal skin. If irritation develops, stop medication for a few days until area calm, then restart a very small amount just to the wart. This medication cannot be used by pregnant women. Patient advised they will receive an email from the [facility] and can purchase the medication online through a link in the email.   Skin cancer screening  Return in about 1 year (around 09/21/2022) for TBSE.  I, Ashok Cordia, CMA, am acting as scribe for Sarina Ser, MD . Documentation: I have reviewed the above documentation for accuracy and completeness, and I agree with the above.  Sarina Ser, MD

## 2021-09-24 ENCOUNTER — Encounter: Payer: Self-pay | Admitting: Dermatology

## 2021-10-23 ENCOUNTER — Encounter: Payer: Self-pay | Admitting: Internal Medicine

## 2021-10-24 MED ORDER — SCOPOLAMINE 1 MG/3DAYS TD PT72: 1.0000 | MEDICATED_PATCH | TRANSDERMAL | 0 refills | Status: DC

## 2021-10-29 ENCOUNTER — Other Ambulatory Visit: Payer: BC Managed Care – PPO

## 2021-10-29 ENCOUNTER — Other Ambulatory Visit: Payer: Self-pay | Admitting: Internal Medicine

## 2021-10-29 DIAGNOSIS — E7800 Pure hypercholesterolemia, unspecified: Secondary | ICD-10-CM

## 2021-10-30 LAB — COMPLETE METABOLIC PANEL WITH GFR
AG Ratio: 2.1 (calc) (ref 1.0–2.5)
ALT: 12 U/L (ref 6–29)
AST: 15 U/L (ref 10–35)
Albumin: 4.4 g/dL (ref 3.6–5.1)
Alkaline phosphatase (APISO): 80 U/L (ref 37–153)
BUN: 20 mg/dL (ref 7–25)
CO2: 26 mmol/L (ref 20–32)
Calcium: 9.1 mg/dL (ref 8.6–10.4)
Chloride: 108 mmol/L (ref 98–110)
Creat: 0.89 mg/dL (ref 0.50–1.03)
Globulin: 2.1 g/dL (calc) (ref 1.9–3.7)
Glucose, Bld: 89 mg/dL (ref 65–99)
Potassium: 4 mmol/L (ref 3.5–5.3)
Sodium: 141 mmol/L (ref 135–146)
Total Bilirubin: 0.5 mg/dL (ref 0.2–1.2)
Total Protein: 6.5 g/dL (ref 6.1–8.1)
eGFR: 77 mL/min/{1.73_m2} (ref >=60)

## 2021-10-30 LAB — LIPID PANEL
Cholesterol: 251 mg/dL — ABNORMAL HIGH (ref <200)
HDL: 61 mg/dL (ref >=50)
LDL Cholesterol (Calc): 169 mg/dL (calc) — ABNORMAL HIGH
Non-HDL Cholesterol (Calc): 190 mg/dL (calc) — ABNORMAL HIGH (ref <130)
Total CHOL/HDL Ratio: 4.1 (calc) (ref <5.0)
Triglycerides: 93 mg/dL (ref <150)

## 2021-10-31 ENCOUNTER — Encounter: Payer: Self-pay | Admitting: Internal Medicine

## 2021-12-03 ENCOUNTER — Encounter: Payer: Self-pay | Admitting: Internal Medicine

## 2022-02-14 ENCOUNTER — Other Ambulatory Visit: Payer: Self-pay | Admitting: Internal Medicine

## 2022-02-14 NOTE — Telephone Encounter (Signed)
Requested Prescriptions  Pending Prescriptions Disp Refills  . meloxicam (MOBIC) 7.5 MG tablet [Pharmacy Med Name: MELOXICAM 7.5 MG TABLET] 90 tablet 0    Sig: TAKE 1 TABLET (7.5 MG TOTAL) BY MOUTH AS NEEDED.     Analgesics:  COX2 Inhibitors Failed - 02/14/2022  3:31 AM      Failed - Manual Review: Labs are only required if the patient has taken medication for more than 8 weeks.      Passed - HGB in normal range and within 360 days    Hemoglobin  Date Value Ref Range Status  08/01/2021 12.7 11.7 - 15.5 g/dL Final  07/02/2019 13.9 11.1 - 15.9 g/dL Final         Passed - Cr in normal range and within 360 days    Creat  Date Value Ref Range Status  10/29/2021 0.89 0.50 - 1.03 mg/dL Final         Passed - HCT in normal range and within 360 days    HCT  Date Value Ref Range Status  08/01/2021 38.2 35.0 - 45.0 % Final   Hematocrit  Date Value Ref Range Status  07/02/2019 41.7 34.0 - 46.6 % Final         Passed - AST in normal range and within 360 days    AST  Date Value Ref Range Status  10/29/2021 15 10 - 35 U/L Final         Passed - ALT in normal range and within 360 days    ALT  Date Value Ref Range Status  10/29/2021 12 6 - 29 U/L Final         Passed - eGFR is 30 or above and within 360 days    GFR, Est African American  Date Value Ref Range Status  01/06/2019 80 > OR = 60 mL/min/1.2m Final   GFR calc Af Amer  Date Value Ref Range Status  07/02/2019 96 >59 mL/min/1.73 Final   GFR, Est Non African American  Date Value Ref Range Status  01/06/2019 69 > OR = 60 mL/min/1.742mFinal   GFR calc non Af Amer  Date Value Ref Range Status  07/02/2019 83 >59 mL/min/1.73 Final   eGFR  Date Value Ref Range Status  10/29/2021 77 > OR = 60 mL/min/1.7332minal    Comment:    The eGFR is based on the CKD-EPI 2021 equation. To calculate  the new eGFR from a previous Creatinine or Cystatin C result, go to  https://www.kidney.org/professionals/ kdoqi/gfr%5Fcalculator          Passed - Patient is not pregnant      Passed - Valid encounter within last 12 months    Recent Outpatient Visits          6 months ago [facility] for general adult medical examination with abnormal findings   [facility]   1 year [facility] multiple sites [facility]   1 year ago Current severe episode of major depressive disorder without psychotic features without prior episode [facility]   1 year ago Routine general medical examination at a health care facility   [facility]   1 year ago Acute cystitis with hematuria   [facility]      Future Appointments  In 7 months Ralene Bathe, MD [place]

## 2022-03-02 ENCOUNTER — Other Ambulatory Visit: Payer: Self-pay | Admitting: Internal Medicine

## 2022-03-04 NOTE — Telephone Encounter (Signed)
VV appt made Requested Prescriptions  Pending Prescriptions Disp Refills   escitalopram (LEXAPRO) 10 MG tablet [Pharmacy Med Name: ESCITALOPRAM 10 MG TABLET] 90 tablet 0    Sig: TAKE 1 TABLET BY MOUTH EVERY DAY     Psychiatry:  Antidepressants - SSRI Failed - 03/02/2022  9:46 AM      Failed - Valid encounter within last 6 months    Recent Outpatient Visits           7 months ago Encounter for general adult medical examination with abnormal findings   [facility] [place], Coralie Keens, NP   1 year ago Rheumatoid arthritis of multiple sites with negative rheumatoid factor [facility])   [facility] [place], Coralie Keens, NP   1 year ago Current severe episode of major depressive disorder without psychotic features without prior episode [facility])   [facility] Kathrine Haddock, NP   1 year ago Routine general medical examination at a health care facility   [place], NP   1 year ago Acute cystitis with hematuria   [place], DO       Future Appointments             In 2 days Garnette Gunner, Coralie Keens, NP [facility], [place]   In 6 months Ralene Bathe, MD [address]

## 2022-03-06 ENCOUNTER — Encounter: Payer: Self-pay | Admitting: Internal Medicine

## 2022-03-06 ENCOUNTER — Telehealth (INDEPENDENT_AMBULATORY_CARE_PROVIDER_SITE_OTHER): Payer: BC Managed Care – PPO | Admitting: Internal Medicine

## 2022-03-06 DIAGNOSIS — M0609 Rheumatoid arthritis without rheumatoid factor, multiple sites: Secondary | ICD-10-CM

## 2022-03-06 DIAGNOSIS — Z6829 Body mass index (BMI) 29.0-29.9, adult: Secondary | ICD-10-CM

## 2022-03-06 DIAGNOSIS — Z789 Other specified health status: Secondary | ICD-10-CM

## 2022-03-06 DIAGNOSIS — E663 Overweight: Secondary | ICD-10-CM | POA: Diagnosis not present

## 2022-03-06 DIAGNOSIS — E7800 Pure hypercholesterolemia, unspecified: Secondary | ICD-10-CM | POA: Diagnosis not present

## 2022-03-06 DIAGNOSIS — F419 Anxiety disorder, unspecified: Secondary | ICD-10-CM | POA: Diagnosis not present

## 2022-03-06 DIAGNOSIS — M5432 Sciatica, left side: Secondary | ICD-10-CM

## 2022-03-06 MED ORDER — PREDNISONE 10 MG PO TABS: ORAL_TABLET | ORAL | 0 refills | Status: DC

## 2022-03-06 MED ORDER — ESCITALOPRAM OXALATE 10 MG PO TABS: 15.0000 mg | ORAL_TABLET | Freq: Every day | ORAL | 1 refills | Status: DC

## 2022-03-06 NOTE — Patient Instructions (Signed)
Sciatica  Sciatica is pain, weakness, tingling, or loss of feeling (numbness) along the sciatic nerve. The sciatic nerve starts in the lower back and goes down the back of each leg. Sciatica usually affects one side of the body. Sciatica usually goes away on its own or with treatment. Sometimes, sciatica may come back. What are the causes? This condition happens when the sciatic nerve is pinched or has pressure put on it. This may be caused by: A disk in between the bones of the spine bulging out too far (herniated disk). Changes in the spinal disks due to aging. A condition that affects a muscle in the butt. Extra bone growth near the sciatic nerve. A break (fracture) of the area between your hip bones (pelvis). Pregnancy. Tumor. This is rare. What increases the risk? You are more likely to develop this condition if you: Play sports that put pressure or stress on the spine. Have poor strength and ease of movement (flexibility). Have had a back injury or back surgery. Sit for long periods of time. Do activities that involve bending or lifting over and over again. Are very overweight (obese). What are the signs or symptoms? Symptoms can vary from mild to very bad. They may include: Any of these problems in the lower back, leg, hip, or butt: Mild tingling, loss of feeling, or dull aches. A burning feeling. Sharp pains. Loss of feeling in the back of the calf or the sole of the foot. Leg weakness. Very bad back pain that makes it hard to move. These symptoms may get worse when you cough, sneeze, or laugh. They may also get worse when you sit or stand for long periods of time. How is this treated? This condition often gets better without any treatment. However, treatment may include: Changing or cutting back on physical activity when you have pain. Exercising, including strengthening and stretching. Putting ice or heat on the affected area. Shots of medicines to relieve pain and  swelling or to relax your muscles. Surgery. Follow these instructions at home: Medicines Take over-the-counter and prescription medicines only as told by your doctor. Ask your doctor if you should avoid driving or using machines while you are taking your medicine. Managing pain     If told, put ice on the affected area. To do this: Put ice in a plastic bag. Place a towel between your skin and the bag. Leave the ice on for 20 minutes, 2-3 times a day. If your skin turns bright red, take off the ice right away to prevent skin damage. The risk of skin damage is higher if you cannot feel pain, heat, or cold. If told, put heat on the affected area. Do this as often as told by your doctor. Use the heat source that your doctor tells you to use, such as a moist heat pack or a heating pad. Place a towel between your skin and the heat source. Leave the heat on for 20-30 minutes. If your skin turns bright red, take off the heat right away to prevent burns. The risk of burns is higher if you cannot feel pain, heat, or cold. Activity  Return to your normal activities when your doctor says that it is safe. Avoid activities that make your symptoms worse. Take short rests during the day. When you rest for a long time, do some physical activity or stretching between periods of rest. Avoid sitting for a long time without moving. Get up and move around at least one time each   hour. Do exercises and stretches as told by your doctor. Do not lift anything that is heavier than 10 lb (4.5 kg). Avoid lifting heavy things even when you do not have symptoms. Avoid lifting heavy things over and over. When you lift objects, always lift in a way that is safe for your body. To do this, you should: Bend your knees. Keep the object close to your body. Avoid twisting. General instructions Stay at a healthy weight. Wear comfortable shoes that support your feet. Avoid wearing high heels. Avoid sleeping on a mattress  that is too soft or too hard. You might have less pain if you sleep on a mattress that is firm enough to support your back. Contact a doctor if: Your pain is not controlled by medicine. Your pain does not get better. Your pain gets worse. Your pain lasts longer than 4 weeks. You lose weight without trying. Get help right away if: You cannot control when you pee (urinate) or poop (have a bowel movement). You have weakness in any of these areas and it gets worse: Lower back. The area between your hip bones. Butt. Legs. You have redness or swelling of your back. You have a burning feeling when you pee. Summary Sciatica is pain, weakness, tingling, or loss of feeling (numbness) along the sciatic nerve. This may include the lower back, legs, hips, and butt. This condition happens when the sciatic nerve is pinched or has pressure put on it. Treatment often includes rest, exercise, medicines, and putting ice or heat on the affected area. This information is not intended to replace advice given to you by your health care provider. Make sure you discuss any questions you have with your health care provider. Document Revised: 07/23/2021 Document Reviewed: 07/23/2021 Elsevier Patient Education  2023 Elsevier Inc.  

## 2022-03-06 NOTE — Assessment & Plan Note (Signed)
Discussed Repatha, will discuss further at her annual exam

## 2022-03-06 NOTE — Progress Notes (Signed)
Virtual Visit via Video Note  I connected with Jamie Stanley on 03/06/22 at 11:20 AM EST by a video enabled telemedicine application and verified that I am speaking with the correct person using two identifiers.  Location: Patient: Home Provider: Office  Persons participating in this video call: Webb Silversmith, NP and Jamie Stanley   I discussed the limitations of evaluation and management by telemedicine and the availability of in person appointments. The patient expressed understanding and agreed to proceed.  History of Present Illness:  Patient due for follow-up of chronic conditions.  Anxiety: Managed on Escitalopram. She has been more anxious lately.  She is not currently seeing a therapist.  She denies depression, SI/HI.  RA: Managed with Cyclobenzaprine, Meloxicam and Tylenol.  She follows with rheumatology.  HLD: Her last LDL was 169, triglycerides 93, 10/2021.  She is statin intolerant due to myalgias.  She tries to consume low-fat diet.   She has also has some left sided sciatica. She did have a fall last week because of this.  She is not currently taking any medications for this.  She reports she has had an injection in her hip in the past for the same.  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     albuterol (VENTOLIN HFA) 108 (90 Base) MCG/ACT inhaler Inhale 2  puffs into the lungs every 6 (six) hours as needed for wheezing or shortness of breath. 8 g 0   cyclobenzaprine (FLEXERIL) 10 MG tablet Take by mouth as needed.     Efinaconazole 10 % SOLN Apply 1 application topically daily. 8 mL 2   EPINEPHrine 0.3 mg/0.3 mL IJ SOAJ injection Inject 0.3 mLs (0.3 mg total) into the muscle as needed for anaphylaxis. (Patient not taking: Reported on 04/17/2021) 2 each 1   escitalopram (LEXAPRO) 10 MG tablet TAKE 1 TABLET BY MOUTH EVERY DAY 90 tablet 0   loratadine (CLARITIN) 10 MG tablet Take 10 mg by mouth daily.     meloxicam (MOBIC) 7.5 MG tablet TAKE 1 TABLET (7.5 MG TOTAL) BY MOUTH AS NEEDED. 90 tablet 0   scopolamine (TRANSDERM-SCOP) 1 MG/3DAYS Place 1 patch (1.5 mg total) onto the skin every 3 (three) days. For motion sickness. Start 2 hr before onset symptoms, up to 12 hr before 4 patch 0   Tavaborole (KERYDIN) 5 % SOLN Apply to toenail Monday, Wednesday, Friday at bedtime 4 mL 2   No current facility-administered medications for this visit.    Allergies  Allergen Reactions   Cinnamon Anaphylaxis   Penicillins Rash    Rash    Demerol [Meperidine] Nausea Only    Nausea      Family History  Problem Relation Age of Onset   Hypertension Mother  Hyperlipidemia Mother    Breast cancer Mother 79       ER/PR type BRCA Neg   AAA (abdominal aortic aneurysm) Mother    Hypothyroidism Mother    Hypertension Father    Hyperlipidemia Father    Glaucoma Father    Heart disease Paternal Grandfather 25   Diabetes Paternal Grandfather    Ovarian cancer Maternal Grandmother 1   Hypothyroidism Maternal Grandmother    Hypothyroidism Sister    Hypothyroidism Paternal Grandmother    Diabetes Paternal Grandmother    Breast cancer Other 61   Breast cancer Other 53   Hypothyroidism Maternal Aunt     Social History   Socioeconomic History   Marital status: Married    Spouse name: Not on file   Number of children: Not on file   Years of education:  Not on file   Highest education level: Not on file  Occupational History   Not on file  Tobacco Use   Smoking status: Never   Smokeless tobacco: Never  Vaping Use   Vaping Use: Never used  Substance and Sexual Activity   Alcohol use: Not Currently    Comment: occasional   Drug use: Never   Sexual activity: Yes    Birth control/protection: None  Other Topics Concern   Not on file  Social History Narrative   Not on file   Social Determinants of Health   Financial Resource Strain: Not on file  Food Insecurity: Not on file  Transportation Needs: Not on file  Physical Activity: Inactive (05/14/2017)   Exercise Vital Sign    Days of Exercise per Week: 0 days    Minutes of Exercise per Session: 0 min  Stress: No Stress Concern Present (05/14/2017)   Golden    Feeling of Stress : Only a little  Social Connections: Socially Integrated (05/14/2017)   Social Connection and Isolation Panel [NHANES]    Frequency of Communication with Friends and Family: More than three times a week    Frequency of Social Gatherings with Friends and Family: More than three times a week    Attends Religious Services: More than 4 times per year    Active Member of [employer] or Organizations: Yes    Attends Archivist Meetings: 1 to 4 times per year    Marital Status: Married  Human resources officer Violence: Not At Risk (05/14/2017)   Humiliation, Afraid, Rape, and Kick questionnaire    Fear of Current or Ex-Partner: No    Emotionally Abused: No    Physically Abused: No    Sexually Abused: No     Constitutional: Denies fever, malaise, fatigue, headache or abrupt weight changes.  HEENT: Denies eye pain, eye redness, ear pain, ringing in the ears, wax buildup, runny nose, nasal congestion, bloody nose, or sore throat. Respiratory: Denies difficulty breathing, shortness of breath, cough or sputum production.   Cardiovascular: Denies  chest pain, chest tightness, palpitations or swelling in the hands or feet.  Gastrointestinal: Denies abdominal pain, bloating, constipation, diarrhea or blood in the stool.  GU: Denies urgency, frequency, pain with urination, burning sensation, blood in urine, odor or discharge. Musculoskeletal: Patient reports joint pain.  Denies decrease in range of motion, difficulty with gait, muscle pain or joint swelling.  Skin: Denies redness, rashes, lesions or ulcercations.  Neurological: Patient reports left-sided sciatica.  Denies dizziness, difficulty with memory, difficulty with speech or problems with balance and coordination.  Psych: Patient has a  history of anxiety.  Denies anxiety, depression, SI/HI.  No other specific complaints in a complete review of systems (except as listed in HPI above).  Observations/Objective:    Wt Readings from Last 3 Encounters:  08/01/21 160 lb (72.6 kg)  04/17/21 155 lb (70.3 kg)  09/15/20 144 lb (65.3 kg)    General: Appears her stated age, well developed, well nourished in NAD. HEENT: Head: normal shape and size; Eyes: sclera white,  EOMs intact;  Pulmonary/Chest: Normal effort. No respiratory distress.   Neurological: Alert and oriented.   Psychiatric: Mood and affect normal. Behavior is normal. Judgment and thought content normal.     BMET    Component Value Date/Time   NA 141 10/29/2021 0838   NA 140 07/02/2019 0000   K 4.0 10/29/2021 0838   CL 108 10/29/2021 0838   CO2 26 10/29/2021 0838   GLUCOSE 89 10/29/2021 0838   BUN 20 10/29/2021 0838   BUN 11 07/02/2019 0000   CREATININE 0.89 10/29/2021 0838   CALCIUM 9.1 10/29/2021 0838   GFRNONAA 83 07/02/2019 0000   GFRNONAA 69 01/06/2019 1151   GFRAA 96 07/02/2019 0000   GFRAA 80 01/06/2019 1151    Lipid Panel     Component Value Date/Time   CHOL 251 (H) 10/29/2021 0838   CHOL 188 07/02/2019 0000   TRIG 93 10/29/2021 0838   HDL 61 10/29/2021 0838   HDL 61 07/02/2019 0000   CHOLHDL  4.1 10/29/2021 0838   LDLCALC 169 (H) 10/29/2021 0838    CBC    Component Value Date/Time   WBC 5.4 08/01/2021 0853   RBC 4.22 08/01/2021 0853   HGB 12.7 08/01/2021 0853   HGB 13.9 07/02/2019 0000   HCT 38.2 08/01/2021 0853   HCT 41.7 07/02/2019 0000   PLT 182 08/01/2021 0853   PLT 214 07/02/2019 0000   MCV 90.5 08/01/2021 0853   MCV 92 07/02/2019 0000   MCH 30.1 08/01/2021 0853   MCHC 33.2 08/01/2021 0853   RDW 12.7 08/01/2021 0853   RDW 12.3 07/02/2019 0000   LYMPHSABS 2,503 07/11/2020 1102   LYMPHSABS 3.0 07/02/2019 0000   EOSABS 143 07/11/2020 1102   EOSABS 0.1 07/02/2019 0000   BASOSABS 22 07/11/2020 1102   BASOSABS 0.0 07/02/2019 0000    Hgb A1C Lab Results  Component Value Date   HGBA1C 5.4 08/01/2021        Assessment and Plan:  Left-Sided Sciatica:  Encourage stretching and ice Rx for Pred taper x9 days  RTC in 6 months for your annual exam  Follow Up Instructions:    I discussed the assessment and treatment plan with the patient. The patient was provided an opportunity to ask questions and all were answered. The patient agreed with the plan and demonstrated an understanding of the instructions.   The patient was advised to call back or seek an in-person evaluation if the symptoms worsen or if the condition fails to improve as anticipated.   Webb Silversmith, NP

## 2022-03-06 NOTE — Assessment & Plan Note (Signed)
Encouraged her to consume low-fat diet Discussed Repatha, will discuss this further at her annual exam

## 2022-03-06 NOTE — Assessment & Plan Note (Signed)
Managed with cyclobenzaprine and Tylenol She plans to schedule an appointment with her rheumatologist for follow-up

## 2022-03-06 NOTE — Assessment & Plan Note (Signed)
Deteriorated We will increase escitalopram to 15 mg daily Support offered

## 2022-03-06 NOTE — Assessment & Plan Note (Signed)
Encourage diet and exercise for weight loss 

## 2022-05-24 ENCOUNTER — Other Ambulatory Visit: Payer: Self-pay | Admitting: Internal Medicine

## 2022-05-24 NOTE — Telephone Encounter (Signed)
Requested Prescriptions  Pending Prescriptions Disp Refills   meloxicam (MOBIC) 7.5 MG tablet [Pharmacy Med Name: MELOXICAM 7.5 MG TABLET] 90 tablet 0    Sig: TAKE 1 TABLET (7.5 MG TOTAL) BY MOUTH AS NEEDED.     Analgesics:  COX2 Inhibitors Failed - 05/24/2022  1:34 AM      Failed - Manual Review: Labs are only required if the patient has taken medication for more than 8 weeks.      Passed - HGB in normal range and within 360 days    Hemoglobin  Date Value Ref Range Status  08/01/2021 12.7 11.7 - 15.5 g/dL Final  07/02/2019 13.9 11.1 - 15.9 g/dL Final         Passed - Cr in normal range and within 360 days    Creat  Date Value Ref Range Status  10/29/2021 0.89 0.50 - 1.03 mg/dL Final         Passed - HCT in normal range and within 360 days    HCT  Date Value Ref Range Status  08/01/2021 38.2 35.0 - 45.0 % Final   Hematocrit  Date Value Ref Range Status  07/02/2019 41.7 34.0 - 46.6 % Final         Passed - AST in normal range and within 360 days    AST  Date Value Ref Range Status  10/29/2021 15 10 - 35 U/L Final         Passed - ALT in normal range and within 360 days    ALT  Date Value Ref Range Status  10/29/2021 12 6 - 29 U/L Final         Passed - eGFR is 30 or above and within 360 days    GFR, Est African American  Date Value Ref Range Status  01/06/2019 80 > OR = 60 mL/min/1.22m Final   GFR calc Af Amer  Date Value Ref Range Status  07/02/2019 96 >59 mL/min/1.73 Final   GFR, Est Non African American  Date Value Ref Range Status  01/06/2019 69 > OR = 60 mL/min/1.782mFinal   GFR calc non Af Amer  Date Value Ref Range Status  07/02/2019 83 >59 mL/min/1.73 Final   eGFR  Date Value Ref Range Status  10/29/2021 77 > OR = 60 mL/min/1.7336minal    Comment:    The eGFR is based on the CKD-EPI 2021 equation. To calculate  the new eGFR from a previous Creatinine or Cystatin C result, go to https://www.kidney.org/professionals/ kdoqi/gfr%5Fcalculator           Passed - Patient is not pregnant      Passed - Valid encounter within last 12 months    Recent Outpatient Visits           2 months ago [facility]umatoid [facility] multiple sites with negative [facility] factor [facility]   9 months ago Encounter for general adult medical examination with abnormal findings   [facility]   1 year ago [facility]umatoid [facility] of multiple sites with negative rheumatoid factor [facility] [facility]   1 year ago Current severe episode of major depressive disorder without psychotic features without prior episode [facility]   1 year ago Routine general medical examination at a health care facility   [facility] Kathrine Hadd[facility], [facility]       Future Appointments             In 4 months Ralene Bathe, MD [place]

## 2022-06-01 ENCOUNTER — Other Ambulatory Visit: Payer: Self-pay | Admitting: Internal Medicine

## 2022-06-03 IMAGING — MG MM DIGITAL SCREENING BILAT W/ TOMO AND CAD
8 series · 8 of 24 positions shown · non-contrast
Comparison: Previous exam(s).

CLINICAL DATA: Screening.

EXAM:
DIGITAL SCREENING BILATERAL MAMMOGRAM WITH TOMOSYNTHESIS AND CAD
TECHNIQUE: Bilateral screening digital craniocaudal and mediolateral oblique
mammograms were obtained. Bilateral screening digital breast
tomosynthesis was performed. The images were evaluated with
computer-aided detection.

[R CC synth-2D]
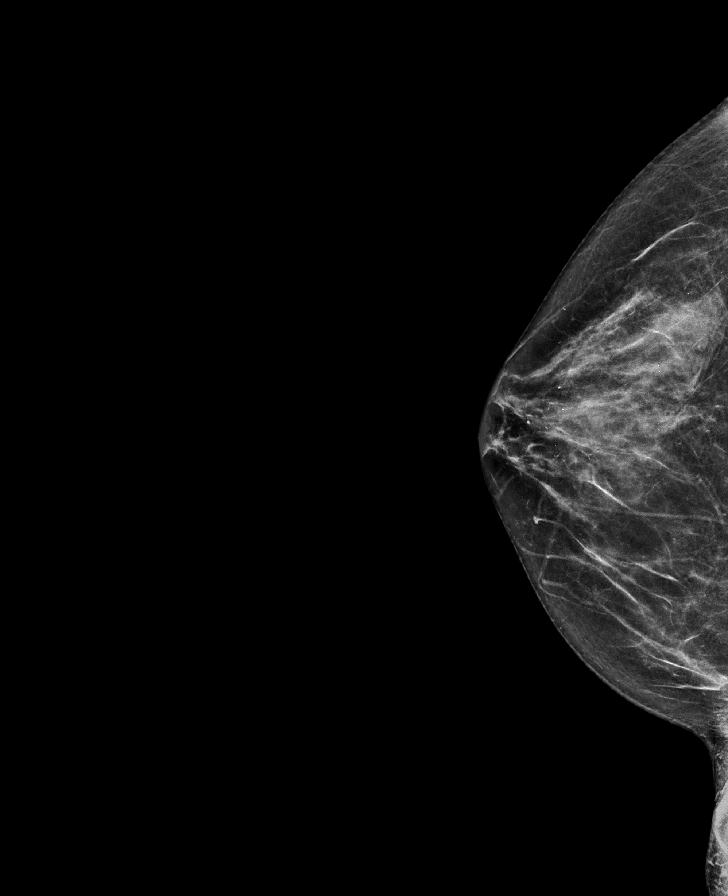

[R MLO synth-2D]
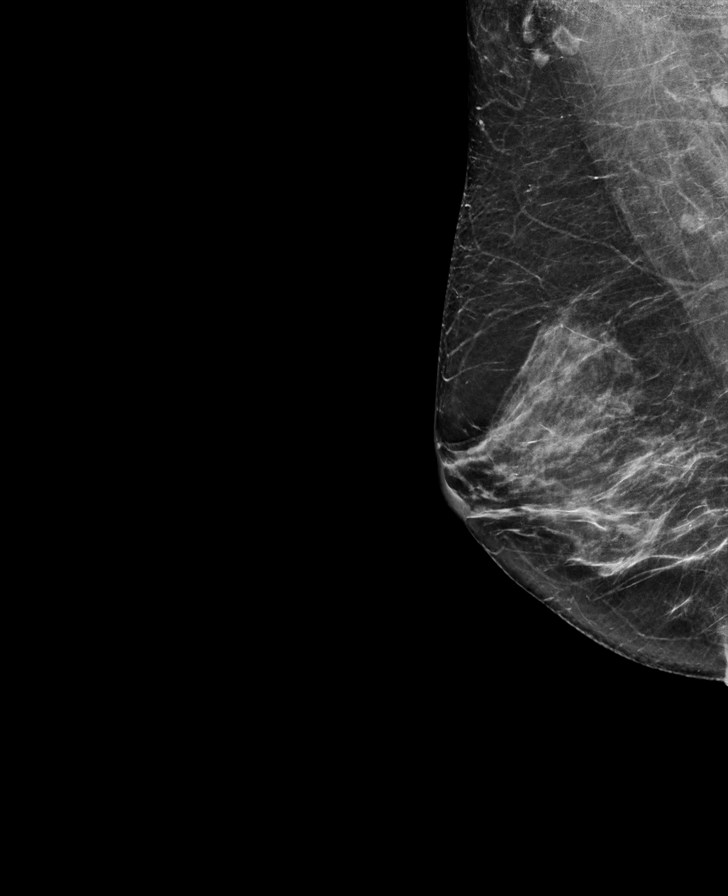

[L CC synth-2D]
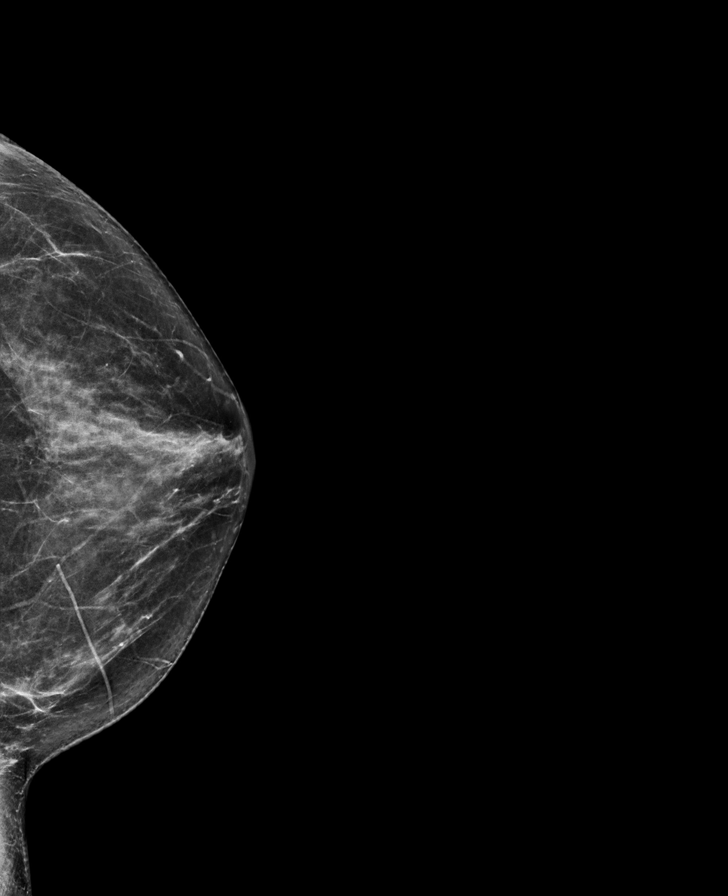

[L MLO synth-2D]
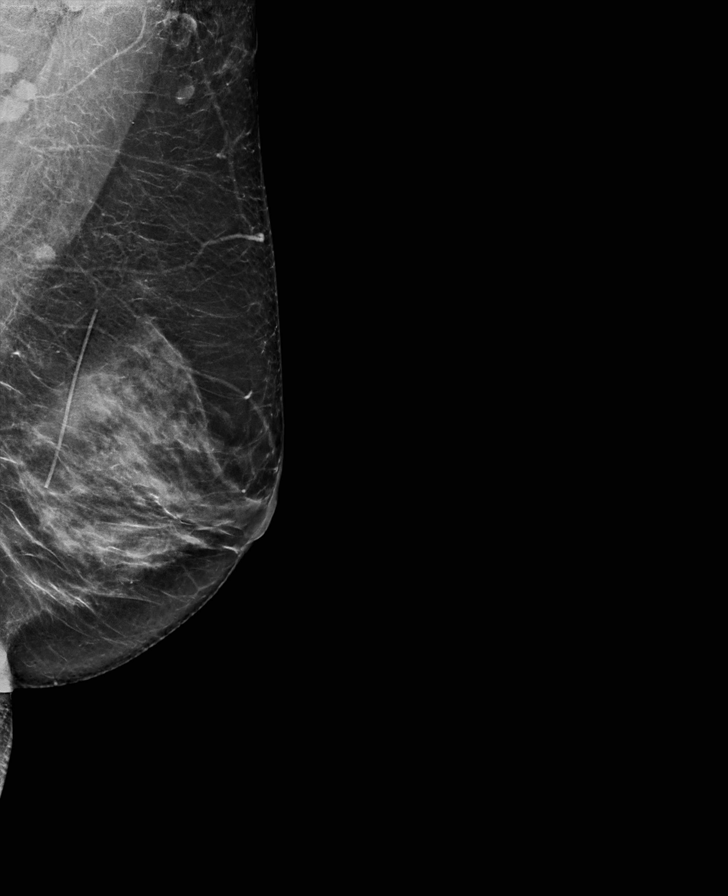

[L CC tomo · tomo slice 34/67.0]
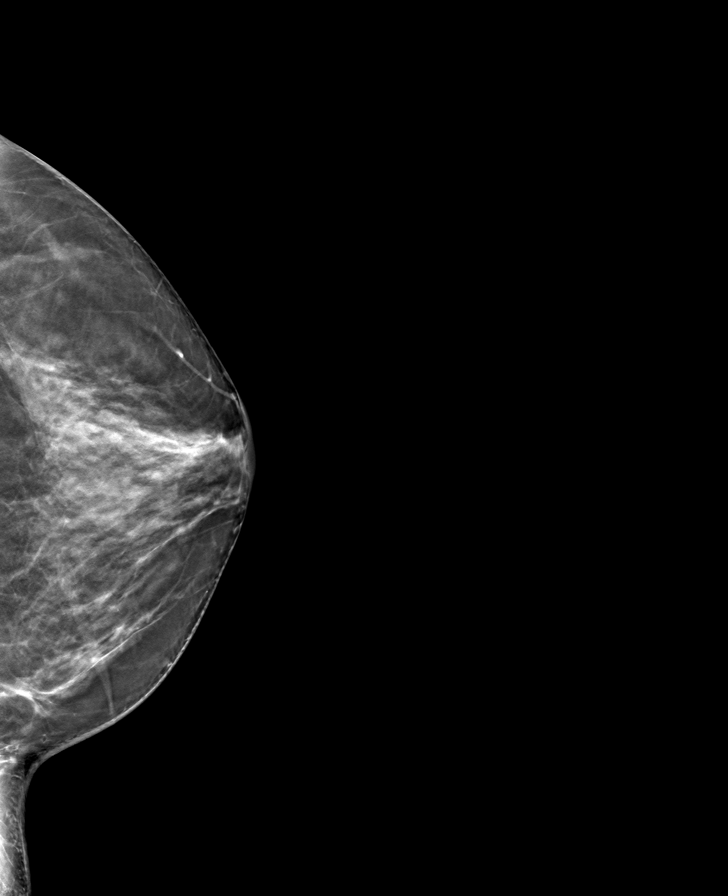

[L MLO tomo · tomo slice 37/73.0]
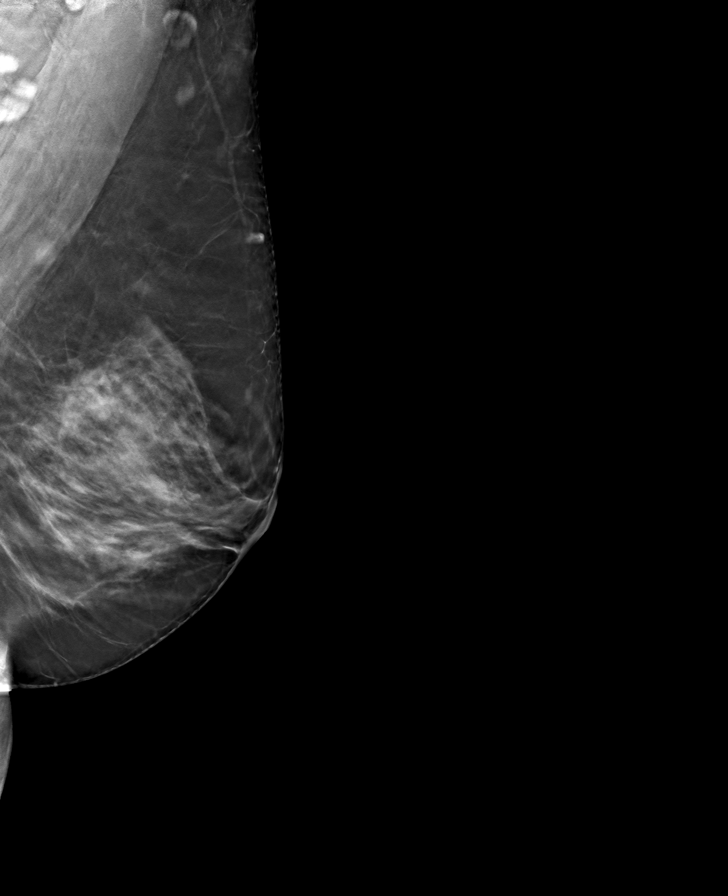

[R CC tomo · tomo slice 34/67.0]
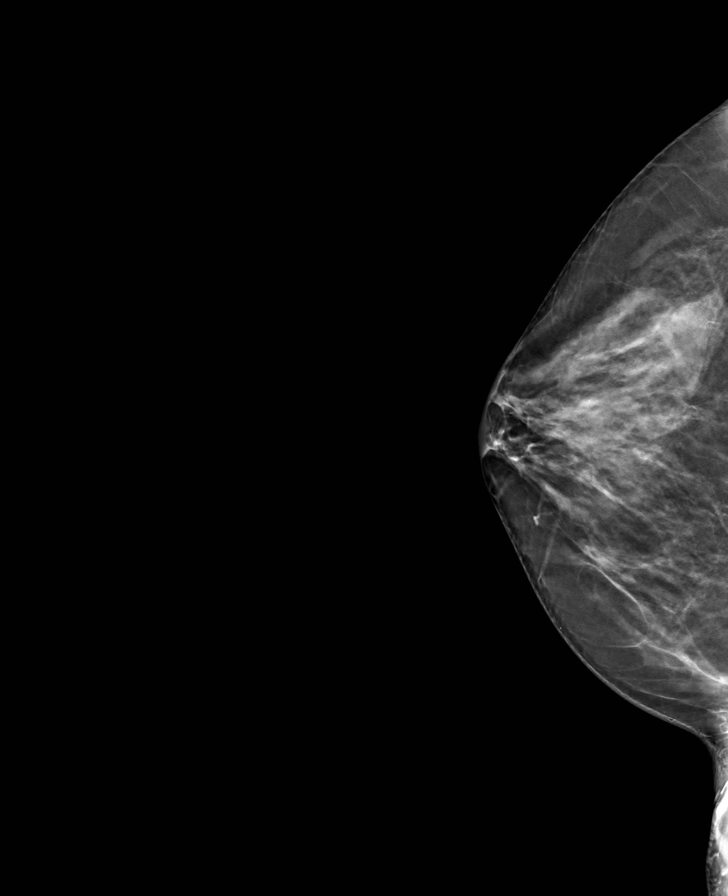

[R MLO tomo · tomo slice 35/70.0]
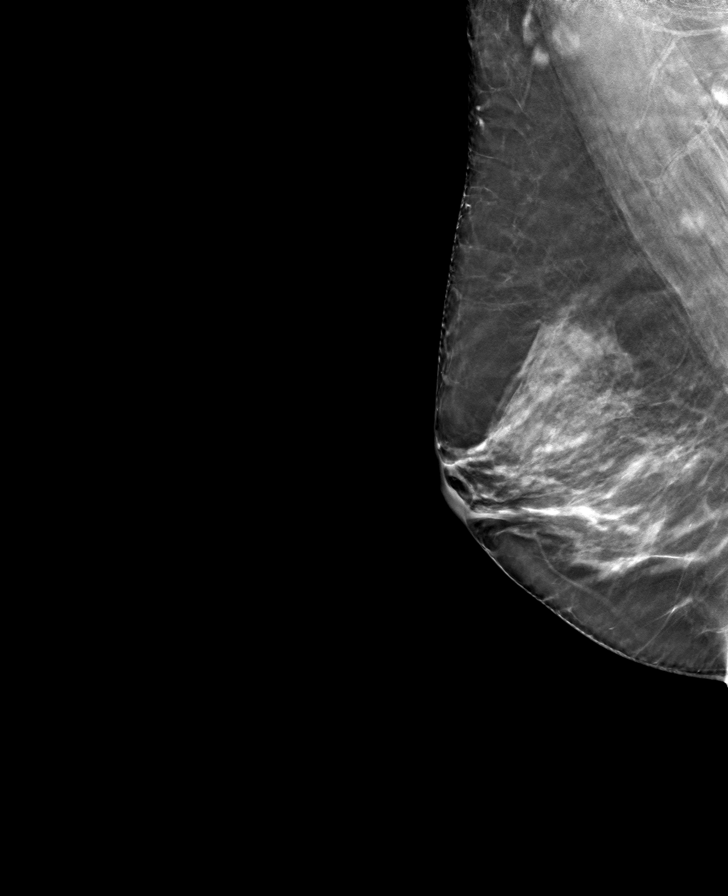

[8 of 24 positions shown; findings below may reference images not displayed]

ACR Breast Density Category c: The breast tissue is heterogeneously
dense, which may obscure small masses.
FINDINGS: There are no findings suspicious for malignancy.
IMPRESSION: No mammographic evidence of malignancy. A result letter of this
screening mammogram will be mailed directly to the patient.

RECOMMENDATION:
Screening mammogram in one year. (Code:[id number])

BI-RADS CATEGORY  1: Negative.

## 2022-06-03 NOTE — Telephone Encounter (Signed)
No future visit at this time.  Requested Prescriptions  Pending Prescriptions Disp Refills   escitalopram (LEXAPRO) 10 MG tablet [Pharmacy Med Name: ESCITALOPRAM 10 MG TABLET] 135 tablet 0    Sig: TAKE 1 TABLET BY MOUTH EVERY DAY     Psychiatry:  Antidepressants - SSRI Passed - 06/01/2022  8:26 AM      Passed - Valid encounter within last 6 months    Recent Outpatient Visits           2 months ago Rheumatoid arthritis of multiple sites with negative rheumatoid factor [facility])   [facility], Coralie Keens, NP   10 months ago Encounter for general adult medical examination with abnormal findings   [facility] [place], Coralie Keens, NP   1 year ago Rheumatoid arthritis of multiple sites with negative rheumatoid factor [facility])   [facility] [place], [place] W, NP   1 year ago Current severe episode of major depressive disorder without psychotic features without prior episode [facility])   [facility] Kathrine Haddock, NP   1 year ago Routine general medical examination at a health care facility   [place] Kathrine Haddock, NP       Future Appointments             In 3 months Ralene Bathe, MD [place]

## 2022-08-30 ENCOUNTER — Encounter: Payer: Self-pay | Admitting: Internal Medicine

## 2022-08-30 ENCOUNTER — Ambulatory Visit: Payer: BC Managed Care – PPO | Admitting: Internal Medicine

## 2022-08-30 VITALS — BP 118/84 | HR 75 | Temp 96.8°F | Ht 62.0 in | Wt 157.0 lb

## 2022-08-30 DIAGNOSIS — E7800 Pure hypercholesterolemia, unspecified: Secondary | ICD-10-CM

## 2022-08-30 DIAGNOSIS — M25562 Pain in left knee: Secondary | ICD-10-CM | POA: Diagnosis not present

## 2022-08-30 DIAGNOSIS — R7309 Other abnormal glucose: Secondary | ICD-10-CM

## 2022-08-30 DIAGNOSIS — Z0001 Encounter for general adult medical examination with abnormal findings: Secondary | ICD-10-CM | POA: Diagnosis not present

## 2022-08-30 DIAGNOSIS — Z1211 Encounter for screening for malignant neoplasm of colon: Secondary | ICD-10-CM | POA: Diagnosis not present

## 2022-08-30 DIAGNOSIS — Z1212 Encounter for screening for malignant neoplasm of rectum: Secondary | ICD-10-CM

## 2022-08-30 DIAGNOSIS — G8929 Other chronic pain: Secondary | ICD-10-CM | POA: Diagnosis not present

## 2022-08-30 DIAGNOSIS — Z1231 Encounter for screening mammogram for malignant neoplasm of breast: Secondary | ICD-10-CM

## 2022-08-30 LAB — CBC
MCHC: 32.8 g/dL (ref 32.0–36.0)
Platelets: 213 10*3/uL (ref 140–400)
RDW: 12.7 % (ref 11.0–15.0)
WBC: 6.1 10*3/uL (ref 3.8–10.8)

## 2022-08-30 MED ORDER — EPINEPHRINE 0.3 MG/0.3ML IJ SOAJ: 0.3000 mg | INTRAMUSCULAR | 1 refills | Status: DC | PRN

## 2022-08-30 MED ORDER — PREDNISONE 10 MG PO TABS: ORAL_TABLET | ORAL | 0 refills | Status: DC

## 2022-08-30 NOTE — Patient Instructions (Signed)
Health Maintenance for Postmenopausal Women Menopause is a normal process in which your ability to get pregnant comes to an end. This process happens slowly over many months or years, usually between the ages of 48 and 55. Menopause is complete when you have missed your menstrual period for 12 months. It is important to talk with your health care provider about some of the most common conditions that affect women after menopause (postmenopausal women). These include heart disease, cancer, and bone loss (osteoporosis). Adopting a healthy lifestyle and getting preventive care can help to promote your health and wellness. The actions you take can also lower your chances of developing some of these common conditions. What are the signs and symptoms of menopause? During menopause, you may have the following symptoms: Hot flashes. These can be moderate or severe. Night sweats. Decrease in sex drive. Mood swings. Headaches. Tiredness (fatigue). Irritability. Memory problems. Problems falling asleep or staying asleep. Talk with your health care provider about treatment options for your symptoms. Do I need hormone replacement therapy? Hormone replacement therapy is effective in treating symptoms that are caused by menopause, such as hot flashes and night sweats. Hormone replacement carries certain risks, especially as you become older. If you are thinking about using estrogen or estrogen with progestin, discuss the benefits and risks with your health care provider. How can I reduce my risk for heart disease and stroke? The risk of heart disease, heart attack, and stroke increases as you age. One of the causes may be a change in the body's hormones during menopause. This can affect how your body uses dietary fats, triglycerides, and cholesterol. Heart attack and stroke are medical emergencies. There are many things that you can do to help prevent heart disease and stroke. Watch your blood pressure High  blood pressure causes heart disease and increases the risk of stroke. This is more likely to develop in people who have high blood pressure readings or are overweight. Have your blood pressure checked: Every 3-5 years if you are 18-39 years of age. Every year if you are 40 years old or older. Eat a healthy diet  Eat a diet that includes plenty of vegetables, fruits, low-fat dairy products, and lean protein. Do not eat a lot of foods that are high in solid fats, added sugars, or sodium. Get regular exercise Get regular exercise. This is one of the most important things you can do for your health. Most adults should: Try to exercise for at least 150 minutes each week. The exercise should increase your heart rate and make you sweat (moderate-intensity exercise). Try to do strengthening exercises at least twice each week. Do these in addition to the moderate-intensity exercise. Spend less time sitting. Even light physical activity can be beneficial. Other tips Work with your health care provider to achieve or maintain a healthy weight. Do not use any products that contain nicotine or tobacco. These products include cigarettes, chewing tobacco, and vaping devices, such as e-cigarettes. If you need help quitting, ask your health care provider. Know your numbers. Ask your health care provider to check your cholesterol and your blood sugar (glucose). Continue to have your blood tested as directed by your health care provider. Do I need screening for cancer? Depending on your health history and family history, you may need to have cancer screenings at different stages of your life. This may include screening for: Breast cancer. Cervical cancer. Lung cancer. Colorectal cancer. What is my risk for osteoporosis? After menopause, you may be   at increased risk for osteoporosis. Osteoporosis is a condition in which bone destruction happens more quickly than new bone creation. To help prevent osteoporosis or  the bone fractures that can happen because of osteoporosis, you may take the following actions: If you are 19-50 years old, get at least 1,000 mg of calcium and at least 600 international units (IU) of vitamin D per day. If you are older than age 50 but younger than age 70, get at least 1,200 mg of calcium and at least 600 international units (IU) of vitamin D per day. If you are older than age 70, get at least 1,200 mg of calcium and at least 800 international units (IU) of vitamin D per day. Smoking and drinking excessive alcohol increase the risk of osteoporosis. Eat foods that are rich in calcium and vitamin D, and do weight-bearing exercises several times each week as directed by your health care provider. How does menopause affect my mental health? Depression may occur at any age, but it is more common as you become older. Common symptoms of depression include: Feeling depressed. Changes in sleep patterns. Changes in appetite or eating patterns. Feeling an overall lack of motivation or enjoyment of activities that you previously enjoyed. Frequent crying spells. Talk with your health care provider if you think that you are experiencing any of these symptoms. General instructions See your health care provider for regular wellness exams and vaccines. This may include: Scheduling regular health, dental, and eye exams. Getting and maintaining your vaccines. These include: Influenza vaccine. Get this vaccine each year before the flu season begins. Pneumonia vaccine. Shingles vaccine. Tetanus, diphtheria, and pertussis (Tdap) booster vaccine. Your health care provider may also recommend other immunizations. Tell your health care provider if you have ever been abused or do not feel safe at home. Summary Menopause is a normal process in which your ability to get pregnant comes to an end. This condition causes hot flashes, night sweats, decreased interest in sex, mood swings, headaches, or lack  of sleep. Treatment for this condition may include hormone replacement therapy. Take actions to keep yourself healthy, including exercising regularly, eating a healthy diet, watching your weight, and checking your blood pressure and blood sugar levels. Get screened for cancer and depression. Make sure that you are up to date with all your vaccines. This information is not intended to replace advice given to you by your health care provider. Make sure you discuss any questions you have with your health care provider. Document Revised: 09/04/2020 Document Reviewed: 09/04/2020 Elsevier Patient Education  2023 Elsevier Inc.  

## 2022-08-30 NOTE — Progress Notes (Signed)
Subjective:    Patient ID: Jamie Stanley, female    DOB: October 02, 1970, 52 y.o.   MRN: [id number]  HPI  Patient presents to clinic today for her annual exam. She also reports left knee pain. This started 6 months ago after a fall. She describes the pain as dull and achy. She reports intermittent swelling. She reports at times it feel like it will give out on her. She has not had any falls because of this knee instability. She has tried Meloxicam or Tylenol with some relief of symptoms.   Flu: 01/2022 Tetanus: 12/2016 COVID: x 3 Shingrix: never Pap smear: 04/2017 Mammogram: 05/2021 Colon screening: 06/2019, Cologuard Vision screening: as needed Dentist: biannually  Diet: She does eat meat. She consumes fruits and veggies. She does eat some fried foods. She drinks mostly unsweet tea and water. 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     albuterol (VENTOLIN HFA) 108 (90 Base) MCG/ACT inhaler Inhale 2 puffs into the lungs every 6 (six) hours as needed for wheezing or shortness of breath. 8 g 0   cyclobenzaprine (FLEXERIL) 10 MG tablet Take by mouth as needed.     Efinaconazole 10 % SOLN Apply 1 application topically daily. 8 mL 2   escitalopram (LEXAPRO) 10 MG tablet TAKE 1 TABLET BY MOUTH EVERY DAY 135 tablet 0   loratadine  (CLARITIN) 10 MG tablet Take 10 mg by mouth daily.     meloxicam (MOBIC) 7.5 MG tablet TAKE 1 TABLET (7.5 MG TOTAL) BY MOUTH AS NEEDED. 90 tablet 0   predniSONE (DELTASONE) 10 MG tablet Take 6 tabs on day 1, 5 tabs on day 2, 4 tabs on day 3, 3 tabs on day 4, 2 tabs on day 5, 1 tab on day 6 21 tablet 0   Tavaborole (KERYDIN) 5 % SOLN Apply to toenail Monday, Wednesday, Friday at bedtime 4 mL 2   No current facility-administered medications for this visit.    Allergies  Allergen Reactions   Cinnamon Anaphylaxis   Penicillins Rash    Rash    Demerol [Meperidine] Nausea Only    Nausea      Family History  Problem Relation Age of Onset   Hypertension Mother    Hyperlipidemia Mother    Breast cancer Mother 103       ER/PR type BRCA Neg   AAA (abdominal aortic aneurysm) Mother    Hypothyroidism Mother    Hypertension Father    Hyperlipidemia Father    Glaucoma Father    Heart disease Paternal Grandfather 62   Diabetes Paternal Grandfather    Ovarian cancer Maternal Grandmother 14   Hypothyroidism Maternal Grandmother    Hypothyroidism  Sister    Hypothyroidism Paternal Grandmother    Diabetes Paternal Grandmother    Breast cancer Other 48   Breast cancer Other 34   Hypothyroidism Maternal Aunt     Social History   Socioeconomic History   Marital status: Married    Spouse name: Not on file   Number of children: Not on file   Years of education: Not on file   Highest education level: Not on file  Occupational History   Not on file  Tobacco Use   Smoking status: Never   Smokeless tobacco: Never  Vaping Use   Vaping Use: Never used  Substance and Sexual Activity   Alcohol use: Not Currently    Comment: occasional   Drug use: Never   Sexual activity: Yes    Birth control/protection: None  Other Topics Concern   Not on file  Social History Narrative   Not on file   Social Determinants of Health   Financial Resource Strain: Not on file  Food Insecurity: Not on  file  Transportation Needs: Not on file  Physical Activity: Inactive (05/14/2017)   Exercise Vital Sign    Days of Exercise per Week: 0 days    Minutes of Exercise per Session: 0 min  Stress: No Stress Concern Present (05/14/2017)   [employer] of Occupational Health - Occupational Stress Questionnaire    Feeling of Stress : Only a little  Social Connections: Socially Integrated (05/14/2017)   Social Connection and Isolation Panel [NHANES]    Frequency of Communication with Friends and Family: More than three times a week    Frequency of Social Gatherings with Friends and Family: More than three times a week    Attends Religious Services: More than 4 times per year    Active Member of [employer] or Organizations: Yes    Attends Banker Meetings: 1 to 4 times per year    Marital Status: Married  Catering manager Violence: Not At Risk (05/14/2017)   Humiliation, Afraid, Rape, and Kick questionnaire    Fear of Current or Ex-Partner: No    Emotionally Abused: No    Physically Abused: No    Sexually Abused: No     Constitutional: Denies fever, malaise, fatigue, headache or abrupt weight changes.  HEENT: Denies eye pain, eye redness, ear pain, ringing in the ears, wax buildup, runny nose, nasal congestion, bloody nose, or sore throat. Respiratory: Denies difficulty breathing, shortness of breath, cough or sputum production.   Cardiovascular: Denies chest pain, chest tightness, palpitations or swelling in the hands or feet.  Gastrointestinal: Denies abdominal pain, bloating, constipation, diarrhea or blood in the stool.  GU: Denies urgency, frequency, pain with urination, burning sensation, blood in urine, odor or discharge. Musculoskeletal: Pt reports joint pain, left knee pain and swelling. Denies decrease in range of motion, difficulty with gait, muscle pain.  Skin: Denies redness, rashes, lesions or ulcercations.  Neurological: Denies dizziness, difficulty with memory,  difficulty with speech or problems with balance and coordination.  Psych: Pt reports anxiety. Denies depression, SI/HI.  No other specific complaints in a complete review of systems (except as listed in HPI above).  Objective:   Physical Exam  BP 118/84 (BP Location: Left Arm, Patient Position: Sitting, Cuff Size: Normal)   Pulse 75   Temp (!) 96.8 F (36 C) (Temporal)   Ht 5\' 2"  (1.575 m)   Wt 157 lb (71.2 kg)   SpO2 99%   BMI 28.72 kg/m   Wt Readings from  Last 3 Encounters:  08/01/21 160 lb (72.6 kg)  04/17/21 155 lb (70.3 kg)  09/15/20 144 lb (65.3 kg)    General: Appears her stated age, overweight, in NAD. Skin: Warm, dry and intact.  HEENT: Head: normal shape and size; Eyes: sclera white, no icterus, conjunctiva pink, PERRLA and EOMs intact;  Neck:  Neck supple, trachea midline. No masses, lumps or thyromegaly present.  Cardiovascular: Normal rate and rhythm. S1,S2 noted.  No murmur, rubs or gallops noted. No JVD or BLE edema. No carotid bruits noted. Pulmonary/Chest: Normal effort and positive vesicular breath sounds. No respiratory distress. No wheezes, rales or ronchi noted.  Abdomen: Normal bowel sounds.  Musculoskeletal: Normal flexion extension of the left knee.  She has swelling around the patella, bilateral pes bursa and inferior patellar tendon.  Pain with palpation over the kneecap, bilateral Pez bursa and inferior patellar tendon.  Strength 5/5 BUE/BLE.  No difficulty with gait.  Neurological: Alert and oriented. Cranial nerves II-XII grossly intact. Coordination normal.  Psychiatric: Mood and affect normal. Behavior is normal. Judgment and thought content normal.    BMET    Component Value Date/Time   NA 141 10/29/2021 0838   NA 140 07/02/2019 0000   K 4.0 10/29/2021 0838   CL 108 10/29/2021 0838   CO2 26 10/29/2021 0838   GLUCOSE 89 10/29/2021 0838   BUN 20 10/29/2021 0838   BUN 11 07/02/2019 0000   CREATININE 0.89 10/29/2021 0838   CALCIUM 9.1  10/29/2021 0838   GFRNONAA 83 07/02/2019 0000   GFRNONAA 69 01/06/2019 1151   GFRAA 96 07/02/2019 0000   GFRAA 80 01/06/2019 1151    Lipid Panel     Component Value Date/Time   CHOL 251 (H) 10/29/2021 0838   CHOL 188 07/02/2019 0000   TRIG 93 10/29/2021 0838   HDL 61 10/29/2021 0838   HDL 61 07/02/2019 0000   CHOLHDL 4.1 10/29/2021 0838   LDLCALC 169 (H) 10/29/2021 0838    CBC    Component Value Date/Time   WBC 5.4 08/01/2021 0853   RBC 4.22 08/01/2021 0853   HGB 12.7 08/01/2021 0853   HGB 13.9 07/02/2019 0000   HCT 38.2 08/01/2021 0853   HCT 41.7 07/02/2019 0000   PLT 182 08/01/2021 0853   PLT 214 07/02/2019 0000   MCV 90.5 08/01/2021 0853   MCV 92 07/02/2019 0000   MCH 30.1 08/01/2021 0853   MCHC 33.2 08/01/2021 0853   RDW 12.7 08/01/2021 0853   RDW 12.3 07/02/2019 0000   LYMPHSABS 2,503 07/11/2020 1102   LYMPHSABS 3.0 07/02/2019 0000   EOSABS 143 07/11/2020 1102   EOSABS 0.1 07/02/2019 0000   BASOSABS 22 07/11/2020 1102   BASOSABS 0.0 07/02/2019 0000    Hgb A1C Lab Results  Component Value Date   HGBA1C 5.4 08/01/2021           Assessment & Plan:   Preventative Health Maintenance:  Encouraged her to get a flu shot in the fall Tetanus UTD Encouraged her to get her covid booster.  Discussed shingrix vaccine, she will check coverage with her insurance company and schedule a visit if she would like to have this done Advised her to call GYN to schedule Pap smear Mammogram ordered-she will call to schedule Cologuard ordered Encouraged her to consume a balanced diet and exercise regimen Advised her to see an eye doctor and dentist annually We will check CBC, c-Met, lipid, A1c today  Chronic Left Knee Pain:  Will obtain x-ray of  left knee Rx for Pred taper x 9 days Consider referral for PT versus MRI pending x-ray RTC in 6 months, follow-up chronic conditions Nicki Reaper, NP

## 2022-08-31 LAB — LIPID PANEL
Cholesterol: 265 mg/dL — ABNORMAL HIGH (ref <200)
HDL: 69 mg/dL (ref >=50)
LDL Cholesterol (Calc): 179 mg/dL (calc) — ABNORMAL HIGH
Non-HDL Cholesterol (Calc): 196 mg/dL (calc) — ABNORMAL HIGH (ref <130)
Total CHOL/HDL Ratio: 3.8 (calc) (ref <5.0)
Triglycerides: 71 mg/dL (ref <150)

## 2022-08-31 LAB — COMPLETE METABOLIC PANEL WITH GFR
AG Ratio: 1.8 (calc) (ref 1.0–2.5)
ALT: 19 U/L (ref 6–29)
AST: 22 U/L (ref 10–35)
Albumin: 4.4 g/dL (ref 3.6–5.1)
Alkaline phosphatase (APISO): 86 U/L (ref 37–153)
BUN: 16 mg/dL (ref 7–25)
CO2: 29 mmol/L (ref 20–32)
Calcium: 9.4 mg/dL (ref 8.6–10.4)
Chloride: 105 mmol/L (ref 98–110)
Creat: 0.85 mg/dL (ref 0.50–1.03)
Globulin: 2.5 g/dL (calc) (ref 1.9–3.7)
Glucose, Bld: 104 mg/dL — ABNORMAL HIGH (ref 65–99)
Potassium: 4.3 mmol/L (ref 3.5–5.3)
Sodium: 140 mmol/L (ref 135–146)
Total Bilirubin: 0.5 mg/dL (ref 0.2–1.2)
Total Protein: 6.9 g/dL (ref 6.1–8.1)
eGFR: 81 mL/min/{1.73_m2} (ref >=60)

## 2022-08-31 LAB — HEMOGLOBIN A1C
Hgb A1c MFr Bld: 5.7 % of total Hgb — ABNORMAL HIGH (ref <5.7)
Mean Plasma Glucose: 117 mg/dL
eAG (mmol/L): 6.5 mmol/L

## 2022-08-31 LAB — CBC
HCT: 39 % (ref 35.0–45.0)
Hemoglobin: 12.8 g/dL (ref 11.7–15.5)
MCH: 29.4 pg (ref 27.0–33.0)
MCV: 89.7 fL (ref 80.0–100.0)
MPV: 9.9 fL (ref 7.5–12.5)
RBC: 4.35 10*6/uL (ref 3.80–5.10)

## 2022-09-02 ENCOUNTER — Encounter: Payer: Self-pay | Admitting: Internal Medicine

## 2022-09-03 ENCOUNTER — Ambulatory Visit
Admission: RE | Admit: 2022-09-03 | Discharge: 2022-09-03 | Disposition: A | Discharge status: Home / Self Care | Payer: BC Managed Care – PPO | Source: Ambulatory Visit | Attending: Internal Medicine | Admitting: Internal Medicine

## 2022-09-03 ENCOUNTER — Ambulatory Visit
Admission: RE | Admit: 2022-09-03 | Discharge: 2022-09-03 | Disposition: A | Discharge status: Home / Self Care | Payer: BC Managed Care – PPO | Attending: Internal Medicine | Admitting: Internal Medicine

## 2022-09-03 DIAGNOSIS — G8929 Other chronic pain: Secondary | ICD-10-CM

## 2022-09-03 DIAGNOSIS — M25562 Pain in left knee: Secondary | ICD-10-CM | POA: Insufficient documentation

## 2022-09-12 ENCOUNTER — Ambulatory Visit
Admission: RE | Admit: 2022-09-12 | Discharge: 2022-09-12 | Disposition: A | Discharge status: Home / Self Care | Payer: BC Managed Care – PPO | Source: Ambulatory Visit | Attending: Internal Medicine | Admitting: Internal Medicine

## 2022-09-12 DIAGNOSIS — Z1231 Encounter for screening mammogram for malignant neoplasm of breast: Secondary | ICD-10-CM | POA: Insufficient documentation

## 2022-09-20 LAB — COLOGUARD: COLOGUARD: NEGATIVE

## 2022-09-26 ENCOUNTER — Ambulatory Visit: Payer: BC Managed Care – PPO | Admitting: Dermatology

## 2022-09-26 VITALS — BP 132/81

## 2022-09-26 DIAGNOSIS — Z7189 Other specified counseling: Secondary | ICD-10-CM

## 2022-09-26 DIAGNOSIS — D1801 Hemangioma of skin and subcutaneous tissue: Secondary | ICD-10-CM | POA: Diagnosis not present

## 2022-09-26 DIAGNOSIS — D229 Melanocytic nevi, unspecified: Secondary | ICD-10-CM

## 2022-09-26 DIAGNOSIS — L988 Other specified disorders of the skin and subcutaneous tissue: Secondary | ICD-10-CM | POA: Diagnosis not present

## 2022-09-26 DIAGNOSIS — Z86018 Personal history of other benign neoplasm: Secondary | ICD-10-CM

## 2022-09-26 DIAGNOSIS — L578 Other skin changes due to chronic exposure to nonionizing radiation: Secondary | ICD-10-CM

## 2022-09-26 DIAGNOSIS — L814 Other melanin hyperpigmentation: Secondary | ICD-10-CM | POA: Diagnosis not present

## 2022-09-26 DIAGNOSIS — L821 Other seborrheic keratosis: Secondary | ICD-10-CM

## 2022-09-26 DIAGNOSIS — Z1283 Encounter for screening for malignant neoplasm of skin: Secondary | ICD-10-CM | POA: Diagnosis not present

## 2022-09-26 DIAGNOSIS — L910 Hypertrophic scar: Secondary | ICD-10-CM

## 2022-09-26 DIAGNOSIS — X32XXXA Exposure to sunlight, initial encounter: Secondary | ICD-10-CM

## 2022-09-26 DIAGNOSIS — W908XXA Exposure to other nonionizing radiation, initial encounter: Secondary | ICD-10-CM

## 2022-09-26 NOTE — Patient Instructions (Addendum)
Counseling for BBL / IPL / Laser and Coordination of Care Discussed the treatment option of Broad Band Light (BBL) /Intense Pulsed Light (IPL)/ Laser for skin discoloration, including brown spots and redness.  Typically we recommend at least 1-3 treatment sessions about 5-8 weeks apart for best results.  Cannot have tanned skin when BBL performed, and regular use of sunscreen is advised after the procedure to help maintain results. The patient's condition may also require "maintenance treatments" in the future.  The fee for BBL / laser treatments is $350 per treatment session for the whole face.  A fee can be quoted for other parts of the body.  Insurance typically does not pay for BBL/laser treatments and therefore the fee is an out-of-pocket cost.   Due to recent changes in healthcare laws, you may see results of your pathology and/or laboratory studies on MyChart before the doctors have had a chance to review them. We understand that in some cases there may be results that are confusing or concerning to you. Please understand that not all results are received at the same time and often the doctors may need to interpret multiple results in order to provide you with the best plan of care or course of treatment. Therefore, we ask that you please give us 2 business days to thoroughly review all your results before contacting the office for clarification. Should we see a critical lab result, you will be contacted sooner.   If You Need Anything After Your Visit  If you have any questions or concerns for your doctor, please call our main line at [phone] and press option 4 to reach your doctor's medical assistant. If no one answers, please leave a voicemail as directed and we will return your call as soon as possible. Messages left after 4 pm will be answered the following business day.   You may also send us a message via MyChart. We typically respond to MyChart messages within 1-2 business days.  For  prescription refills, please ask your pharmacy to contact our office. Our fax number is [phone].  If you have an urgent issue when the clinic is closed that cannot wait until the next business day, you can page your doctor at the number below.    Please note that while we do our best to be available for urgent issues outside of office hours, we are not available 24/7.   If you have an urgent issue and are unable to reach us, you may choose to seek medical care at your doctor's office, retail clinic, urgent care center, or emergency room.  If you have a medical emergency, please immediately call 911 or go to the emergency department.  Pager Numbers  - Dr. Kowalski: [phone]  - Dr. Moye: [phone]  - Dr. Stewart: [phone]  In the event of inclement weather, please call our main line at [phone] for an update on the status of any delays or closures.  Dermatology Medication Tips: Please keep the boxes that topical medications come in in order to help keep track of the instructions about where and how to use these. Pharmacies typically print the medication instructions only on the boxes and not directly on the medication tubes.   If your medication is too expensive, please contact our office at [phone] option 4 or send us a message through MyChart.   We are unable to tell what your co-pay for medications will be in advance as this is different depending on your insurance coverage. However, we   may be able to find a substitute medication at lower cost or fill out paperwork to get insurance to cover a needed medication.   If a prior authorization is required to get your medication covered by your insurance company, please allow us 1-2 business days to complete this process.  Drug prices often vary depending on where the prescription is filled and some pharmacies may offer cheaper prices.  The website www.goodrx.com contains coupons for medications through different  pharmacies. The prices here do not account for what the cost may be with help from insurance (it may be cheaper with your insurance), but the website can give you the price if you did not use any insurance.  - You can print the associated coupon and take it with your prescription to the pharmacy.  - You may also stop by our office during regular business hours and pick up a GoodRx coupon card.  - If you need your prescription sent electronically to a different pharmacy, notify our office through  MyChart or by phone at [phone] option 4.     Si Usted Necesita Algo Despus de Su Visita  Tambin puede enviarnos un mensaje a travs de MyChart. Por lo general respondemos a los mensajes de MyChart en el transcurso de 1 a 2 das hbiles.  Para renovar recetas, por favor pida a su farmacia que se ponga en contacto con nuestra oficina. Nuestro nmero de fax es el [phone].  Si tiene un asunto urgente cuando la clnica est cerrada y que no puede esperar hasta el siguiente da hbil, puede llamar/localizar a su doctor(a) al nmero que aparece a continuacin.   Por favor, tenga en cuenta que aunque hacemos todo lo posible para estar disponibles para asuntos urgentes fuera del horario de oficina, no estamos disponibles las 24 horas del da, los 7 das de la semana.   Si tiene un problema urgente y no puede comunicarse con nosotros, puede optar por buscar atencin mdica  en el consultorio de su doctor(a), en una clnica privada, en un centro de atencin urgente o en una sala de emergencias.  Si tiene una emergencia mdica, por favor llame inmediatamente al 911 o vaya a la sala de emergencias.  Nmeros de bper  - Dr. Kowalski: [phone]  - Dra. Moye: [phone]  - Dra. Stewart: [phone]  En caso de inclemencias del tiempo, por favor llame a nuestra lnea principal al [phone] para una actualizacin sobre el estado de cualquier retraso o cierre.  Consejos para la  medicacin en dermatologa: Por favor, guarde las cajas en las que vienen los medicamentos de uso tpico para ayudarle a seguir las instrucciones sobre dnde y cmo usarlos. Las farmacias generalmente imprimen las instrucciones del medicamento slo en las cajas y no directamente en los tubos del medicamento.   Si su medicamento es muy caro, por favor, pngase en contacto con nuestra oficina llamando al [phone] y presione la opcin 4 o envenos un mensaje a travs de MyChart.   No podemos decirle cul ser su copago por los medicamentos por adelantado ya que esto es diferente dependiendo de la cobertura de su seguro. Sin embargo, es posible que podamos encontrar un medicamento sustituto a menor costo o llenar un formulario para que el seguro cubra el medicamento que se considera necesario.   Si se requiere una autorizacin previa para que su compaa de seguros cubra su medicamento, por favor permtanos de 1 a 2 das hbiles para completar este proceso.  Los precios de los medicamentos   varan con frecuencia dependiendo del lugar de dnde se surte la receta y alguna farmacias pueden ofrecer precios ms baratos.  El sitio web www.goodrx.com tiene cupones para medicamentos de diferentes farmacias. Los precios aqu no tienen en cuenta lo que podra costar con la ayuda del seguro (puede ser ms barato con su seguro), pero el sitio web puede darle el precio si no utiliz ningn seguro.  - Puede imprimir el cupn correspondiente y llevarlo con su receta a la farmacia.  - Tambin puede pasar por nuestra oficina durante el horario de atencin regular y recoger una tarjeta de cupones de GoodRx.  - Si necesita que su receta se enve electrnicamente a una farmacia diferente, informe a nuestra oficina a travs de MyChart de [place] o por telfono llamando al [phone] y presione la opcin 4.\ 

## 2022-09-26 NOTE — Progress Notes (Signed)
Follow-Up Visit   Subjective  Jamie Stanley is a 52 y.o. female who presents for the following: Skin Cancer Screening and Full Body Skin Exam - History of dysplastic nevus   The patient presents for Total-Body Skin Exam (TBSE) for skin cancer screening and mole check. The patient has spots, moles and lesions to be evaluated, some may be new or changing and the patient has concerns that these could be cancer.    The following portions of the chart were reviewed this encounter and updated as appropriate: medications, allergies, medical history  Review of Systems:  No other skin or systemic complaints except as noted in HPI or Assessment and Plan.  Objective  Well appearing patient in no apparent distress; mood and affect are within normal limits.  A full examination was performed including scalp, head, eyes, ears, nose, lips, neck, chest, axillae, abdomen, back, buttocks, bilateral upper extremities, bilateral lower extremities, hands, feet, fingers, toes, fingernails, and toenails. All findings within normal limits unless otherwise noted below.   Relevant physical exam findings are noted in the Assessment and Plan.    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  - Call if any new or changing lesions are noted between office visits  FACIAL ELASTOSIS Exam: Rhytides and volume loss.  Treatment Plan: Discussed Botox.  Recommend daily broad spectrum sunscreen SPF 30+ to sun-exposed areas, reapply every 2 hours as needed. Call for new or changing lesions.  Staying in the shade or wearing long sleeves, sun glasses (UVA+UVB protection) and wide brim hats (4-inch brim around the entire circumference of the hat) are also recommended for sun protection.    HEMANGIOMA Exam: red papule(s) Discussed benign nature. Recommend observation. Call for changes. Counseling for BBL / IPL / Laser and Coordination of Care Discussed the treatment option of Broad Band Light (BBL) /Intense Pulsed Light (IPL)/ Laser for skin discoloration, including brown spots and redness.  Typically we recommend at least 1-3 treatment sessions about 5-8 weeks apart for best results.  Cannot have tanned skin when BBL performed, and regular use of sunscreen is advised after the procedure to help maintain results. The patient's condition may also require "maintenance treatments" in the future.  The fee for BBL / laser treatments is $350 per treatment session for the whole face.  A fee can be quoted for other parts of the body.  Insurance typically does not pay for BBL/laser treatments and therefore the fee is an out-of-pocket cost.  LENTIGINES, SEBORRHEIC KERATOSES - Benign normal skin lesions - Benign-appearing - Call for any changes  MELANOCYTIC NEVI - Tan-brown and/or pink-flesh-colored symmetric macules and papules - Benign appearing on exam today - Observation - Call clinic for new or changing moles - Recommend daily use of broad spectrum spf 30+ sunscreen to sun-exposed areas.   ACTINIC DAMAGE - Chronic condition, secondary to cumulative UV/sun exposure - diffuse scaly erythematous macules with underlying dyspigmentation - Recommend daily broad spectrum sunscreen SPF 30+ to sun-exposed areas, reapply every 2 hours as needed.  - Staying in the shade or wearing long sleeves, sun glasses (UVA+UVB protection) and wide brim hats (4-inch brim around the entire circumference of the hat) are also recommended for sun protection.  - Call for new or changing lesions.  SKIN CANCER SCREENING PERFORMED TODAY.  Return in about 1 year (around 09/26/2023) for TBSE.  I, Joanie Coddington, CMA, am acting as scribe for Armida Sans, MD .   Documentation: I have reviewed the above  documentation for accuracy and completeness, and I agree with the above.  Armida Sans, MD

## 2022-10-09 ENCOUNTER — Encounter: Payer: Self-pay | Admitting: Dermatology

## 2022-11-06 NOTE — Progress Notes (Unsigned)
PCP:  Lorre Munroe, NP   No chief complaint on file.    HPI:      Ms. Sharlet Notaro is a 52 y.o. [phone] who LMP was No LMP recorded. (Menstrual status: Perimenopausal)., presents today for her annual examination.  Her menses are absent since 9/20 on OCPs. No PMB, rare vasomotor sx.   Sex activity: single partner, contraception -post menopausal status. Pt has vag dryness, using lubricants with relief.  Last Pap: 05/14/17  Results were: no abnormalities /neg HPV DNA  Hx of STDs: none  Last mammogram: 09/12/22 Results were: normal--routine follow-up in 12 months There is a FH of breast cancer in her mom, mat grt aunt and pat grt aunt.  There is a FH of ovarian cancer in her MGM. Pt is My Risk neg 2016, her mom is BRCA neg 2015. IBIS=33%. The patient does do self-breast exams. Is taking Vit D supp. Has not had breast MRI; declined in past.   Tobacco use: The patient denies current or previous tobacco use. Alcohol use: rare No drug use.  Exercise: not active  She does get adequate calcium and Vitamin D in her diet.  Labs/hyperlipidemia with PCP now. Diagnosed with RA after moderna  vaccine. Did methotrexate short term but managing with NSAIDs.  Colonoscopy: Neg cologuard 2021 and 2024, repeat after 3 yrs.    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   Physiological ovarian cysts    Rheumatoid arthritis (HCC)    Screening for colon cancer 2021   Cologuard neg; repeat after 3 yrs    Past Surgical History:  Procedure Laterality Date   APPENDECTOMY     BIOPSY BREAST     left    BREAST EXCISIONAL BIOPSY Left 2012   benign   INGUINAL HERNIA REPAIR       Family History  Problem Relation Age of Onset   Hypertension Mother    Hyperlipidemia Mother    Breast cancer Mother 36       ER/PR type BRCA Neg   AAA (abdominal aortic aneurysm) Mother    Hypothyroidism Mother    Hypertension Father    Hyperlipidemia Father    Glaucoma Father    Heart disease Paternal Grandfather 10   Diabetes Paternal Grandfather    Ovarian cancer Maternal Grandmother 26   Hypothyroidism Maternal Grandmother    Hypothyroidism Sister    Hypothyroidism Paternal Grandmother    Diabetes Paternal Grandmother    Breast cancer Other 8   Breast cancer Other 70   Hypothyroidism Maternal Aunt     Social History   Socioeconomic History   Marital status: Married    Spouse name: Not on file   Number of children: Not on file   Years of education: Not on file   Highest education level: Not on file  Occupational History   Not on file  Tobacco Use   Smoking status: Never   Smokeless tobacco: Never  Vaping Use   Vaping Use: Never used  Substance and Sexual Activity   Alcohol use: Not Currently  Comment: occasional   Drug use: Never   Sexual activity: Yes    Birth control/protection: None  Other Topics Concern   Not on file  Social History Narrative   Not on file   Social Determinants of Health   Financial Resource Strain: Not on file  Food Insecurity: Not on file  Transportation Needs: Not on file  Physical Activity: Inactive (05/14/2017)   Exercise Vital Sign    Days of Exercise per Week: 0 days    Minutes of Exercise per Session: 0 min  Stress: No Stress Concern Present (05/14/2017)   [employer] of Occupational Health - Occupational Stress Questionnaire    Feeling of Stress : Only a little  Social Connections: Socially Integrated (05/14/2017)   Social Connection and Isolation Panel [NHANES]    Frequency of Communication with Friends and Family: More than three times a week    Frequency of Social Gatherings with Friends and Family:  More than three times a week    Attends Religious Services: More than 4 times per year    Active Member of [employer] or Organizations: Yes    Attends Banker Meetings: 1 to 4 times per year    Marital Status: Married  Catering manager Violence: Not At Risk (05/14/2017)   Humiliation, Afraid, Rape, and Kick questionnaire    Fear of Current or Ex-Partner: No    Emotionally Abused: No    Physically Abused: No    Sexually Abused: No    No outpatient medications have been marked as taking for the 11/07/22 encounter (Appointment) with Arvon Schreiner, Ilona Sorrel, PA-C.     ROS:  Review of Systems  Constitutional:  Negative for fatigue, fever and unexpected weight change.  Respiratory:  Negative for cough, shortness of breath and wheezing.   Cardiovascular:  Negative for chest pain, palpitations and leg swelling.  Gastrointestinal:  Negative for blood in stool, constipation, diarrhea, nausea and vomiting.  Endocrine: Negative for cold intolerance, heat intolerance and polyuria.  Genitourinary:  Negative for dyspareunia, dysuria, flank pain, frequency, genital sores, hematuria, menstrual problem, pelvic pain, urgency, vaginal bleeding, vaginal discharge and vaginal pain.  Musculoskeletal:  Positive for arthralgias, joint swelling and myalgias. Negative for back pain.  Skin:  Negative for rash.  Neurological:  Negative for dizziness, syncope, light-headedness, numbness and headaches.  Hematological:  Negative for adenopathy.  Psychiatric/Behavioral:  Negative for agitation, confusion, sleep disturbance and suicidal ideas. The patient is not nervous/anxious.      Objective: There were no vitals taken for this visit.   Physical Exam Constitutional:      Appearance: She is well-developed.  Genitourinary:     Vulva and rectum normal.     Right Labia: No rash, tenderness or lesions.    Left Labia: No tenderness, lesions or rash.    No vaginal discharge, erythema or tenderness.       Right Adnexa: not tender and no mass present.    Left Adnexa: not tender and no mass present.    No cervical friability or polyp.     Uterus is not enlarged or tender.  Rectum:     Guaiac result negative.     No tenderness.  Breasts:    Right: No mass, nipple discharge, skin change or tenderness.     Left: No mass, nipple discharge, skin change or tenderness.  Neck:     Thyroid: No thyromegaly.  Cardiovascular:     Rate and Rhythm: Normal rate and regular rhythm.     Heart  sounds: Normal heart sounds. No murmur heard. Pulmonary:     Effort: Pulmonary effort is normal.     Breath sounds: Normal breath sounds.  Abdominal:     Palpations: Abdomen is soft.     Tenderness: There is no abdominal tenderness. There is no guarding or rebound.  Musculoskeletal:        General: Normal range of motion.     Cervical back: Normal range of motion.  Lymphadenopathy:     Cervical: No cervical adenopathy.  Neurological:     General: No focal deficit present.     Mental Status: She is alert and oriented to person, place, and time.     Cranial Nerves: No cranial nerve deficit.  Skin:    General: Skin is warm and dry.  Psychiatric:        Mood and Affect: Mood normal.        Behavior: Behavior normal.        Thought Content: Thought content normal.        Judgment: Judgment normal.  Vitals reviewed.    Assessment/Plan: Encounter for annual routine gynecological examination  Encounter for screening mammogram for malignant neoplasm of breast - Plan: MM 3D SCREEN BREAST BILATERAL; pt to sheds mammo  Family history of breast cancer - Plan: MM 3D SCREEN BREAST BILATERAL; Pt is MyRisk neg.  Increased risk of breast cancer - Plan: MM 3D SCREEN BREAST BILATERAL; Cont monthly SBE, yearly CBE and mammos, Cont Vit D. Pt to call after mammo to sched scr breast MRI for this yr if desires.         GYN counsel breast self exam, mammography screening, adequate intake of calcium and vitamin D, diet and  exercise     F/U  No follow-ups on file.  Tallan Sandoz B. Hunt Zajicek, PA-C 11/06/2022 8:09 PM

## 2022-11-07 ENCOUNTER — Ambulatory Visit (INDEPENDENT_AMBULATORY_CARE_PROVIDER_SITE_OTHER): Payer: BC Managed Care – PPO | Admitting: Obstetrics and Gynecology

## 2022-11-07 ENCOUNTER — Other Ambulatory Visit (HOSPITAL_COMMUNITY)
Admission: RE | Admit: 2022-11-07 | Discharge: 2022-11-07 | Disposition: A | Discharge status: Home / Self Care | Payer: BC Managed Care – PPO | Source: Ambulatory Visit | Attending: Obstetrics and Gynecology | Admitting: Obstetrics and Gynecology

## 2022-11-07 ENCOUNTER — Encounter: Payer: Self-pay | Admitting: Obstetrics and Gynecology

## 2022-11-07 VITALS — BP 104/72 | Ht 62.0 in | Wt 158.0 lb

## 2022-11-07 DIAGNOSIS — Z9189 Other specified personal risk factors, not elsewhere classified: Secondary | ICD-10-CM

## 2022-11-07 DIAGNOSIS — Z01419 Encounter for gynecological examination (general) (routine) without abnormal findings: Secondary | ICD-10-CM | POA: Diagnosis not present

## 2022-11-07 DIAGNOSIS — Z124 Encounter for screening for malignant neoplasm of cervix: Secondary | ICD-10-CM | POA: Insufficient documentation

## 2022-11-07 DIAGNOSIS — R1031 Right lower quadrant pain: Secondary | ICD-10-CM

## 2022-11-07 DIAGNOSIS — Z1151 Encounter for screening for human papillomavirus (HPV): Secondary | ICD-10-CM | POA: Diagnosis present

## 2022-11-07 DIAGNOSIS — Z803 Family history of malignant neoplasm of breast: Secondary | ICD-10-CM

## 2022-11-07 DIAGNOSIS — R140 Abdominal distension (gaseous): Secondary | ICD-10-CM

## 2022-11-07 DIAGNOSIS — Z1231 Encounter for screening mammogram for malignant neoplasm of breast: Secondary | ICD-10-CM

## 2022-11-07 NOTE — Patient Instructions (Signed)
I value your feedback and you entrusting us with your care. If you get a [facility] patient survey, I would appreciate you taking the time to let us know about your experience today. Thank you! ? ? ?

## 2022-11-12 LAB — CYTOLOGY - PAP
Comment: NEGATIVE
Diagnosis: NEGATIVE
High risk HPV: NEGATIVE

## 2022-11-14 ENCOUNTER — Other Ambulatory Visit: Payer: Self-pay | Admitting: Internal Medicine

## 2022-11-14 NOTE — Telephone Encounter (Signed)
Requested Prescriptions  Pending Prescriptions Disp Refills   escitalopram (LEXAPRO) 10 MG tablet [Pharmacy Med Name: ESCITALOPRAM 10 MG TABLET] 90 tablet 0    Sig: TAKE 1 TABLET BY MOUTH EVERY DAY     Psychiatry:  Antidepressants - SSRI Passed - 11/14/2022  2:34 AM      Passed - Valid encounter within last 6 months    Recent Outpatient Visits           2 months ago Encounter for general adult medical examination with abnormal findings   [place] [place], [place] W, NP   8 months ago Rheumatoid arthritis of multiple sites with negative rheumatoid factor [facility])   [place] [facility] [place], Salvadore Oxford, NP   1 year ago Encounter for general adult medical examination with abnormal findings   [facility] [place], [place] W, NP   2 years ago Rheumatoid arthritis of multiple sites with negative rheumatoid factor [facility])   [place] [facility] [place], [place] W, NP   2 years ago Current severe episode of major depressive disorder without psychotic features without prior episode [facility])   [place] [facility] Gabriel Cirri, NP       Future Appointments             In 3 months Baity, Salvadore Oxford, NP [place] [facility]   In 10 months Deirdre Evener, MD [facility]

## 2022-12-02 ENCOUNTER — Ambulatory Visit (INDEPENDENT_AMBULATORY_CARE_PROVIDER_SITE_OTHER): Payer: BC Managed Care – PPO

## 2022-12-02 DIAGNOSIS — R102 Pelvic and perineal pain: Secondary | ICD-10-CM | POA: Diagnosis not present

## 2022-12-02 DIAGNOSIS — R1031 Right lower quadrant pain: Secondary | ICD-10-CM

## 2022-12-02 DIAGNOSIS — R140 Abdominal distension (gaseous): Secondary | ICD-10-CM

## 2022-12-04 ENCOUNTER — Encounter: Payer: Self-pay | Admitting: Obstetrics and Gynecology

## 2022-12-15 ENCOUNTER — Encounter: Payer: Self-pay | Admitting: Internal Medicine

## 2022-12-16 ENCOUNTER — Ambulatory Visit: Payer: Self-pay

## 2022-12-16 NOTE — Telephone Encounter (Signed)
See My Chart message.  Pt has been advised to schedule a virtual visit.   Thanks,   -Vernona Rieger

## 2022-12-16 NOTE — Telephone Encounter (Signed)
Chief Complaint: Covid+ Symptoms: mild cough, headache, congestion, runny nose, low grade fever, loss of taste and smell Frequency: constant  Pertinent Negatives: Patient denies SOB Disposition: [] ED /[] Urgent Care (no appt availability in office) / [] Appointment(In office/virtual)/ []  [facility]/ [] Home Care/ [] Refused Recommended Disposition /[] [facility]/ [x]  Follow-up with PCP Additional Notes: Patient stated that she began having Covid symptoms on 12/13/22 in the evening. She tested positive with a home test kit on 12/15/22. Patient stated her symptoms are getting better, she no long has a fever as of today. Care advice given and patient verbalized understanding. Patient wants to know if provider thinks she needs to be seen and how long she should stay out of work. She is a Physiological scientist. Advised I would forward message to provider for any additional recommendations. Patient verbalized understanding.   Summary: tested positive for covid   Patient stated she tested positive for covid on & patient's symptoms are headache, congestion, mucus in chest, stuffy & runny nose, low grade fever, highest was 100.2 but it broke last night. Patient has a history of bronchitis and does not want it to turn into that. Patient wanted to schedule a virtual with PCP but nothing available until Wednesday. Patient stated she can not wait that long, please advise.   Patient's callback # [phone]     Reason for Disposition  [1] HIGH RISK patient AND [2] influenza is widespread in the community AND [3] ONE OR MORE respiratory symptoms: cough, sore throat, runny or stuffy nose  Answer Assessment - Initial Assessment Questions 1. COVID-19 DIAGNOSIS: "How do you know that you have COVID?" (e.g., positive lab test or self-test, diagnosed by doctor or NP/PA, symptoms after exposure).     Yesterday I tested positive with a home kit test  2. COVID-19 EXPOSURE: "Was there any known exposure  to COVID before the symptoms began?" CDC Definition of close contact: within 6 feet (2 meters) for a total of 15 minutes or more over a 24-hour period.      No  3. ONSET: "When did the COVID-19 symptoms start?"      Friday night  4. WORST SYMPTOM: "What is your worst symptom?" (e.g., cough, fever, shortness of breath, muscle aches)     Congestion  5. COUGH: "Do you have a cough?" If Yes, ask: "How bad is the cough?"       Mild cough 6. FEVER: "Do you have a fever?" If Yes, ask: "What is your temperature, how was it measured, and when did it start?"     98.2 today  7. RESPIRATORY STATUS: "Describe your breathing?" (e.g., normal; shortness of breath, wheezing, unable to speak)      Normal 8. BETTER-SAME-WORSE: "Are you getting better, staying the same or getting worse compared to yesterday?"  If getting worse, ask, "In what way?"     A little better  9. OTHER SYMPTOMS: "Do you have any other symptoms?"  (e.g., chills, fatigue, headache, loss of smell or taste, muscle pain, sore throat)     Headache, congestion,mucus in the chest, runny nose, loss of smell and taste  10. HIGH RISK DISEASE: "Do you have any chronic medical problems?" (e.g., asthma, heart or lung disease, weak immune system, obesity, etc.)       Rheumatoid arthritis, childhood asthma  11. VACCINE: "Have you had the COVID-19 vaccine?" If Yes, ask: "Which one, how many shots, when did you get it?"       Yes, 4 shots  total  Protocols used: Coronavirus (COVID-19) Diagnosed or Suspected-A-AH

## 2023-02-14 ENCOUNTER — Other Ambulatory Visit: Payer: Self-pay | Admitting: Internal Medicine

## 2023-02-14 NOTE — Telephone Encounter (Signed)
Requested Prescriptions  Pending Prescriptions Disp Refills   escitalopram (LEXAPRO) 10 MG tablet [Pharmacy Med Name: ESCITALOPRAM 10 MG TABLET] 90 tablet 0    Sig: TAKE 1 TABLET BY MOUTH EVERY DAY     Psychiatry:  Antidepressants - SSRI Passed - 02/14/2023  1:40 AM      Passed - Valid encounter within last 6 months    Recent Outpatient Visits           5 months ago Encounter for general adult medical examination with abnormal findings   [place] [facility] [place], [place] W, NP   11 months ago Rheumatoid arthritis of multiple sites with negative rheumatoid factor [facility])   [place] [facility] [place], Salvadore Oxford, NP   1 year ago Encounter for general adult medical examination with abnormal findings   [place] [facility] [place], [place], NP   2 years ago Rheumatoid arthritis of multiple sites with negative rheumatoid factor [facility])   [place] [place], [place] W, NP   2 years ago Current severe episode of major depressive disorder without psychotic features without prior episode [facility])    [facility] Gabriel Cirri, NP       Future Appointments             In 3 weeks Baity, Salvadore Oxford, NP  [facility], [facility]   In 7 months Deirdre Evener, MD [facility]

## 2023-02-26 ENCOUNTER — Ambulatory Visit (INDEPENDENT_AMBULATORY_CARE_PROVIDER_SITE_OTHER): Payer: Self-pay | Admitting: Dermatology

## 2023-02-26 DIAGNOSIS — D1800 Hemangioma unspecified site: Secondary | ICD-10-CM

## 2023-02-26 NOTE — Patient Instructions (Addendum)
For areas that were treated today with BBL  Recommend using a gentle cleanser and do not scrub at areas. Can use polysporin or neosporin to any inflamed areas. Use gentle moisturizer to areas daily. Let areas fall off naturally     Counseling for BBL / IPL / Laser and Coordination of Care Discussed the treatment option of Broad Band Light (BBL) /Intense Pulsed Light (IPL)/ Laser for skin discoloration, including brown spots and redness.  Typically we recommend at least 1-3 treatment sessions about 5-8 weeks apart for best results.  Cannot have tanned skin when BBL performed, and regular use of sunscreen/photoprotection is advised after the procedure to help maintain results. The patient's condition may also require "maintenance treatments" in the future.  The fee for BBL / laser treatments is $350 per treatment session for the whole face.  A fee can be quoted for other parts of the body.     Due to recent changes in healthcare laws, you may see results of your pathology and/or laboratory studies on MyChart before the doctors have had a chance to review them. We understand that in some cases there may be results that are confusing or concerning to you. Please understand that not all results are received at the same time and often the doctors may need to interpret multiple results in order to provide you with the best plan of care or course of treatment. Therefore, we ask that you please give [place] 2 business days to thoroughly review all your results before contacting the office for clarification. Should we see a critical lab result, you will be contacted sooner.   If You Need Anything After Your Visit  If you have any questions or concerns for your doctor, please call our main line at [phone] and press option 4 to reach your doctor's medical assistant. If no one answers, please leave a voicemail as directed and we will return your call as soon as possible. Messages left after 4 pm will be  answered the following business day.   You may also send [place] a message via MyChart. We typically respond to MyChart messages within 1-2 business days.  For prescription refills, please ask your pharmacy to contact our office. Our fax number is [phone].  If you have an urgent issue when the clinic is closed that cannot wait until the next business day, you can page your doctor at the number below.    Please note that while we do our best to be available for urgent issues outside of office hours, we are not available 24/7.   If you have an urgent issue and are unable to reach [place], you may choose to seek medical care at your doctor's office, retail clinic, urgent care center, or emergency room.  If you have a medical emergency, please immediately call 911 or go to the emergency department.  Pager Numbers  - Dr. Gwen Pounds: [phone]  - Dr. Roseanne Reno: [phone]  - Dr. Katrinka Blazing: [phone]   In the event of inclement weather, please call our main line at [phone] for an update on the status of any delays or closures.  Dermatology Medication Tips: Please keep the boxes that topical medications come in in order to help keep track of the instructions about where and how to use these. Pharmacies typically print the medication instructions only on the boxes and not directly on the medication tubes.   If your medication is too expensive, please contact our office at [phone] option 4 or send  [place] a message through MyChart.   We are unable to tell what your co-pay for medications will be in advance as this is different depending on your insurance coverage. However, we may be able to find a substitute medication at lower cost or fill out paperwork to get insurance to cover a needed medication.   If a prior authorization is required to get your medication covered by your insurance company, please allow [place] 1-2 business days to complete this process.  Drug prices often vary depending on  where the prescription is filled and some pharmacies may offer cheaper prices.  The website www.goodrx.com contains coupons for medications through different pharmacies. The prices here do not account for what the cost may be with help from insurance (it may be cheaper with your insurance), but the website can give you the price if you did not use any insurance.  - You can print the associated coupon and take it with your prescription to the pharmacy.  - You may also stop by our office during regular business hours and pick up a GoodRx coupon card.  - If you need your prescription sent electronically to a different pharmacy, notify our office through [facility] or by phone at [phone] option 4.     Si Usted Necesita Algo Despus de Su Visita  Tambin puede enviarnos un mensaje a travs de Clinical cytogeneticist. Por lo general respondemos a los mensajes de MyChart en el transcurso de 1 a 2 das hbiles.  Para renovar recetas, por favor pida a su farmacia que se ponga en contacto con nuestra oficina. Annie Sable de fax es [place] [phone].  Si tiene un asunto urgente cuando la clnica est cerrada y que no puede esperar hasta el siguiente da hbil, puede llamar/localizar a su doctor(a) al nmero que aparece a continuacin.   Por favor, tenga en cuenta que aunque hacemos todo lo posible para estar disponibles para asuntos urgentes fuera del horario de [place], no estamos disponibles las 24 horas del da, los 7 [address]  [address] de la [place].   Si tiene un problema urgente y no puede comunicarse con nosotros, puede optar por buscar atencin mdica  en el consultorio de su doctor(a), en una clnica privada, en un centro de atencin urgente o en una sala de emergencias.  Si tiene Engineer, drilling, por favor llame inmediatamente al 911 o vaya a la sala de emergencias.  Nmeros de bper  - Dr. Gwen Pounds: [phone]  - Dra. Roseanne Reno: [phone]  - Dr. Katrinka Blazing: [phone]   En caso de inclemencias  del tiempo, por favor llame a Lacy Duverney principal al [phone] para una actualizacin sobre el [place] de cualquier retraso o cierre.  Consejos para la medicacin en dermatologa: Por favor, guarde las cajas en las que vienen los medicamentos de uso tpico para ayudarle a seguir las instrucciones sobre dnde y cmo usarlos. Las farmacias generalmente imprimen las instrucciones del medicamento slo en las cajas y no directamente en los tubos del [place].   Si su medicamento es muy caro, por favor, pngase en contacto con Rolm Gala llamando al [phone] y presione la opcin 4 o envenos un mensaje a travs de Clinical cytogeneticist.   No podemos decirle cul ser su copago por los medicamentos por adelantado ya que esto es diferente dependiendo de la cobertura de su seguro. Sin embargo, es posible que podamos encontrar un medicamento sustituto a Audiological scientist un formulario para que el seguro cubra el medicamento que se considera necesario.   Si  se requiere una autorizacin previa para que su compaa de seguros [place] su medicamento, por favor permtanos de 1 a 2 das hbiles para completar [address]  Los precios de los medicamentos varan con frecuencia dependiendo del Environmental consultant de dnde se surte la receta y alguna farmacias pueden ofrecer precios ms baratos.  El sitio web www.goodrx.com tiene cupones para medicamentos de Health and safety inspector. Los precios aqu no tienen en cuenta lo que podra costar con la ayuda del seguro (puede ser ms barato con su seguro), pero el sitio web puede darle el precio si no utiliz Tourist information centre manager.  - Puede imprimir el cupn correspondiente y llevarlo con su receta a la farmacia.  - Tambin puede pasar por nuestra oficina durante el horario de atencin regular y Education officer, museum una tarjeta de cupones de GoodRx.  - Si necesita que su receta se enve electrnicamente a una farmacia diferente, informe a nuestra oficina a travs de MyChart de  o por telfono  llamando al [phone] y presione la opcin 4.

## 2023-02-26 NOTE — Progress Notes (Signed)
   Follow-Up Visit   Subjective  Jamie Stanley is a 52 y.o. female who presents for the following: bbl today to treat hemangiomas    The patient has spots, moles and lesions to be evaluated, some may be new or changing and the patient may have concern these could be cancer.   The following portions of the chart were reviewed this encounter and updated as appropriate: medications, allergies, medical history  Review of Systems:  No other skin or systemic complaints except as noted in HPI or Assessment and Plan.  Objective  Well appearing patient in no apparent distress; mood and affect are within normal limits.   A focused examination was performed of the following areas: Trunk and extremities    Relevant exam findings are noted in the Assessment and Plan.                       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   Wynelle Link avoidance was stressed. The patient will call with any problems, questions or concerns prior to their next appointment.    No follow-ups on file.  IAsher Muir, CMA, am acting as scribe for Armida Sans, MD.   Documentation: I have reviewed the above documentation for accuracy and completeness, and I agree with the above.  Armida Sans, MD

## 2023-03-07 ENCOUNTER — Ambulatory Visit: Payer: BC Managed Care – PPO | Admitting: Internal Medicine

## 2023-03-07 ENCOUNTER — Encounter: Payer: Self-pay | Admitting: Internal Medicine

## 2023-03-07 VITALS — BP 112/78 | HR 72 | Ht 62.0 in | Wt 154.4 lb

## 2023-03-07 DIAGNOSIS — E663 Overweight: Secondary | ICD-10-CM

## 2023-03-07 DIAGNOSIS — Z6828 Body mass index (BMI) 28.0-28.9, adult: Secondary | ICD-10-CM

## 2023-03-07 DIAGNOSIS — R7303 Prediabetes: Secondary | ICD-10-CM

## 2023-03-07 DIAGNOSIS — F419 Anxiety disorder, unspecified: Secondary | ICD-10-CM | POA: Diagnosis not present

## 2023-03-07 DIAGNOSIS — M0609 Rheumatoid arthritis without rheumatoid factor, multiple sites: Secondary | ICD-10-CM

## 2023-03-07 DIAGNOSIS — G8929 Other chronic pain: Secondary | ICD-10-CM

## 2023-03-07 DIAGNOSIS — Z789 Other specified health status: Secondary | ICD-10-CM

## 2023-03-07 DIAGNOSIS — R1031 Right lower quadrant pain: Secondary | ICD-10-CM

## 2023-03-07 DIAGNOSIS — E7800 Pure hypercholesterolemia, unspecified: Secondary | ICD-10-CM | POA: Diagnosis not present

## 2023-03-07 MED ORDER — MELOXICAM 7.5 MG PO TABS: 7.5000 mg | ORAL_TABLET | ORAL | 1 refills | Status: AC | PRN

## 2023-03-07 NOTE — Assessment & Plan Note (Signed)
C-Met and lipid profile today Encouraged her to consume a low-fat diet Discussed possibly trying atorvastatin 2 times weekly pending lipid profile results

## 2023-03-07 NOTE — Patient Instructions (Signed)

## 2023-03-07 NOTE — Assessment & Plan Note (Signed)
Continue Tylenol, meloxicam and cyclobenzaprine per rheumatology

## 2023-03-07 NOTE — Assessment & Plan Note (Signed)
Concern for adhesions status post appendectomy Discussed CT abdomen/pelvis if symptoms persist as ultrasound did not show any acute findings Advised her if this is scar tissue, this will not show up on CT and she made any referral to general surgery for laparoscopic surgical evaluation

## 2023-03-07 NOTE — Assessment & Plan Note (Signed)
Stable on her current dose of escitalopram Support offered

## 2023-03-07 NOTE — Assessment & Plan Note (Signed)
Encouraged diet and exercise for weight loss ?

## 2023-03-07 NOTE — Assessment & Plan Note (Signed)
A1c today Encourage low-carb diet and exercise for weight loss

## 2023-03-07 NOTE — Assessment & Plan Note (Signed)
Failed rosuvastatin, simvastatin and ezetimibe due to increased joint pain and myalgias Lipid profile today Discuss starting atorvastatin 2 times weekly versus Repatha pending lipid profile results

## 2023-03-07 NOTE — Progress Notes (Signed)
Subjective:    Patient ID: Jamie Stanley, female    DOB: Jan 11, 1971, 52 y.o.   MRN: [id number]  HPI  Patient presents to clinic today for 79-month follow-up of chronic conditions.  Anxiety: Chronic, managed on escitalopram.  She is not seeing a therapist.  She denies depression, SI/HI.  RA: Managed with cyclobenzaprine, meloxicam and tylenol.  She follows with rheumatology.  HLD: Her last LDL was 179, triglycerides 71, 08/2022.  She is statin intolerant due to myalgias.  She has failed rosuvastatin, simvastatin and ezetimibe in the past.  She tries to consume a low-fat diet.  Prediabetes: Her last A1c was 5.7%, 08/2022.  She is not taking any oral diabetic medication at this time.  She does not check her sugars.  Chronic LLQ abdominal pain: This started about a year ago.  The pain is intermittent.  The pain can be sharp at times.  The pain does not radiate.  She denies nausea, vomiting, diarrhea, constipation or blood in her stool.  She denies urinary or vaginal complaints.  She saw GYN for the same who did an ultrasound 11/2022 that did not show any acute findings.  She has a history of ovarian cyst.  She has had an appendectomy in the past and is concerned that there may be scar tissue buildup in that area.  Review of Systems     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   Physiological ovarian cysts    Rheumatoid arthritis (HCC)    Screening for colon cancer 2021   Cologuard neg; repeat after 3 yrs    Current Outpatient Medications  Medication Sig Dispense Refill   acetaminophen (TYLENOL) 325 MG tablet Take 650 mg by mouth every 6 (six) hours as needed.      albuterol (VENTOLIN HFA) 108 (90 Base) MCG/ACT inhaler Inhale 2 puffs into the lungs every 6 (six) hours as needed for wheezing or shortness of breath. 8 g 0   cyclobenzaprine (FLEXERIL) 10 MG tablet Take by mouth as needed.     Efinaconazole 10 % SOLN Apply 1 application topically daily. 8 mL 2   EPINEPHrine 0.3 mg/0.3 mL IJ SOAJ injection Inject 0.3 mg into the muscle as needed for anaphylaxis. 1 each 1   escitalopram (LEXAPRO) 10 MG tablet TAKE 1 TABLET BY MOUTH EVERY DAY 90 tablet 0   loratadine (CLARITIN) 10 MG tablet Take 10 mg by mouth daily. As needed.     meloxicam (MOBIC) 7.5 MG tablet TAKE 1 TABLET (7.5 MG TOTAL) BY MOUTH AS NEEDED. 90 tablet 0   Tavaborole (KERYDIN) 5 % SOLN Apply to toenail Monday, Wednesday, Friday at bedtime 4 mL 2   No current facility-administered medications for this visit.    Allergies  Allergen Reactions   Cinnamon Anaphylaxis   Penicillins Rash    Rash    Demerol [Meperidine] Nausea Only    Nausea      Family History  Problem Relation Age of Onset   Hypertension Mother    Hyperlipidemia Mother    Breast cancer Mother 50  ER/PR type BRCA Neg   AAA (abdominal aortic aneurysm) Mother    Hypothyroidism Mother    Hypertension Father    Hyperlipidemia Father    Glaucoma Father    Heart disease Paternal Grandfather 52   Diabetes Paternal Grandfather    Ovarian cancer Maternal Grandmother 33   Hypothyroidism Maternal Grandmother    Hypothyroidism Sister    Hypothyroidism Paternal Grandmother    Diabetes Paternal Grandmother    Breast cancer Other 79   Breast cancer Other 67   Hypothyroidism Maternal Aunt     Social History   Socioeconomic History   Marital status: Married    Spouse name: Not on file   Number of children: Not on file   Years of education: Not on file   Highest education level: Bachelor's degree (e.g., BA, AB, BS)  Occupational History   Not on file  Tobacco Use   Smoking status: Never   Smokeless tobacco:  Never  Vaping Use   Vaping status: Never Used  Substance and Sexual Activity   Alcohol use: Yes    Comment: occasional   Drug use: Never   Sexual activity: Yes    Birth control/protection: None  Other Topics Concern   Not on file  Social History Narrative   Not on file   Social Determinants of Health   Financial Resource Strain: Low Risk  (03/05/2023)   Overall Financial Resource Strain (CARDIA)    Difficulty of Paying Living Expenses: Not very hard  Food Insecurity: No Food Insecurity (03/05/2023)   Hunger Vital Sign    Worried About Running Out of Food in the Last Year: Never true    Ran Out of Food in the Last Year: Never true  Transportation Needs: No Transportation Needs (03/05/2023)   PRAPARE - Administrator, Civil Service (Medical): No    Lack of Transportation (Non-Medical): No  Physical Activity: Insufficiently Active (03/05/2023)   Exercise Vital Sign    Days of Exercise per Week: 1 day    Minutes of Exercise per Session: 10 min  Stress: No Stress Concern Present (03/05/2023)   [employer] of Occupational Health - Occupational Stress Questionnaire    Feeling of Stress : Only a little  Social Connections: Socially Integrated (03/05/2023)   Social Connection and Isolation Panel [NHANES]    Frequency of Communication with Friends and Family: More than three times a week    Frequency of Social Gatherings with Friends and Family: More than three times a week    Attends Religious Services: More than 4 times per year    Active Member of [employer] or Organizations: Yes    Attends Engineer, structural: More than 4 times per year    Marital Status: Married  Catering manager Violence: Not At Risk (05/14/2017)   Humiliation, Afraid, Rape, and Kick questionnaire    Fear of Current or Ex-Partner: No    Emotionally Abused: No    Physically Abused: No    Sexually Abused: No     Constitutional: Denies fever, malaise, fatigue, headache or abrupt weight  changes.  HEENT: Denies eye pain, eye redness, ear pain, ringing in the ears, wax buildup, runny nose, nasal congestion, bloody nose, or sore throat. Respiratory: Denies difficulty breathing, shortness of breath, cough or sputum production.   Cardiovascular: Denies chest pain, chest tightness, palpitations or swelling in the hands or feet.  Gastrointestinal: Pt reports right side mid abdominal pain. Denies bloating, constipation, diarrhea or blood in the stool.  GU:  Denies urgency, frequency, pain with urination, burning sensation, blood in urine, odor or discharge. Musculoskeletal: Patient reports joint pain.  Denies decrease in range of motion, difficulty with gait, muscle pain or joint swelling.  Skin: Denies redness, rashes, lesions or ulcercations.  Neurological: Denies dizziness, difficulty with memory, difficulty with speech or problems with balance and coordination.  Psych: Patient has a history of anxiety.  Denies depression, SI/HI.  No other specific complaints in a complete review of systems (except as listed in HPI above).  Objective:   Physical Exam  BP 112/78   Pulse 72   Ht 5\' 2"  (1.575 m)   Wt 154 lb 6.4 oz (70 kg)   LMP 12/31/2018   SpO2 96%   BMI 28.24 kg/m   Wt Readings from Last 3 Encounters:  11/07/22 158 lb (71.7 kg)  08/30/22 157 lb (71.2 kg)  08/01/21 160 lb (72.6 kg)    General: Appears her stated age, overweight, in NAD. Skin: Warm, dry and intact.  HEENT: Head: normal shape and size; Eyes: sclera white, no icterus, conjunctiva pink, PERRLA and EOMs intact;  Cardiovascular: Normal rate and rhythm. S1,S2 noted.  No murmur, rubs or gallops noted. No JVD or BLE edema. No carotid bruits noted. Pulmonary/Chest: Normal effort and positive vesicular breath sounds. No respiratory distress. No wheezes, rales or ronchi noted.  Abdomen: Soft and tender at McBurney's point. Normal bowel sounds. No distention or masses noted. Liver, spleen and kidneys non  palpable. Musculoskeletal: No signs of joint swelling. No difficulty with gait.  Neurological: Alert and oriented. Cranial nerves II-XII grossly intact. Coordination normal.  Psychiatric: Mood and affect normal. Behavior is normal. Judgment and thought content normal.    BMET    Component Value Date/Time   NA 140 08/30/2022 0911   NA 140 07/02/2019 0000   K 4.3 08/30/2022 0911   CL 105 08/30/2022 0911   CO2 29 08/30/2022 0911   GLUCOSE 104 (H) 08/30/2022 0911   BUN 16 08/30/2022 0911   BUN 11 07/02/2019 0000   CREATININE 0.85 08/30/2022 0911   CALCIUM 9.4 08/30/2022 0911   GFRNONAA 83 07/02/2019 0000   GFRNONAA 69 01/06/2019 1151   GFRAA 96 07/02/2019 0000   GFRAA 80 01/06/2019 1151    Lipid Panel     Component Value Date/Time   CHOL 265 (H) 08/30/2022 0911   CHOL 188 07/02/2019 0000   TRIG 71 08/30/2022 0911   HDL 69 08/30/2022 0911   HDL 61 07/02/2019 0000   CHOLHDL 3.8 08/30/2022 0911   LDLCALC 179 (H) 08/30/2022 0911    CBC    Component Value Date/Time   WBC 6.1 08/30/2022 0911   RBC 4.35 08/30/2022 0911   HGB 12.8 08/30/2022 0911   HGB 13.9 07/02/2019 0000   HCT 39.0 08/30/2022 0911   HCT 41.7 07/02/2019 0000   PLT 213 08/30/2022 0911   PLT 214 07/02/2019 0000   MCV 89.7 08/30/2022 0911   MCV 92 07/02/2019 0000   MCH 29.4 08/30/2022 0911   MCHC 32.8 08/30/2022 0911   RDW 12.7 08/30/2022 0911   RDW 12.3 07/02/2019 0000   LYMPHSABS 2,503 07/11/2020 1102   LYMPHSABS 3.0 07/02/2019 0000   EOSABS 143 07/11/2020 1102   EOSABS 0.1 07/02/2019 0000   BASOSABS 22 07/11/2020 1102   BASOSABS 0.0 07/02/2019 0000    Hgb A1C Lab Results  Component Value Date   HGBA1C 5.7 (H) 08/30/2022           Assessment & Plan:  RTC in 6 months for your annual exam Nicki Reaper, NP

## 2023-03-08 LAB — LIPID PANEL
Cholesterol: 264 mg/dL — ABNORMAL HIGH (ref <200)
HDL: 67 mg/dL (ref >=50)
LDL Cholesterol (Calc): 174 mg/dL — ABNORMAL HIGH
Non-HDL Cholesterol (Calc): 197 mg/dL — ABNORMAL HIGH (ref <130)
Total CHOL/HDL Ratio: 3.9 (calc) (ref <5.0)
Triglycerides: 107 mg/dL (ref <150)

## 2023-03-08 LAB — COMPLETE METABOLIC PANEL WITHOUT GFR
AG Ratio: 1.6 (calc) (ref 1.0–2.5)
ALT: 23 U/L (ref 6–29)
AST: 20 U/L (ref 10–35)
Albumin: 4.5 g/dL (ref 3.6–5.1)
Alkaline phosphatase (APISO): 96 U/L (ref 37–153)
BUN: 15 mg/dL (ref 7–25)
CO2: 28 mmol/L (ref 20–32)
Calcium: 9.6 mg/dL (ref 8.6–10.4)
Chloride: 104 mmol/L (ref 98–110)
Creat: 0.96 mg/dL (ref 0.50–1.03)
Globulin: 2.8 g/dL (ref 1.9–3.7)
Glucose, Bld: 97 mg/dL (ref 65–99)
Potassium: 4.3 mmol/L (ref 3.5–5.3)
Sodium: 140 mmol/L (ref 135–146)
Total Bilirubin: 0.5 mg/dL (ref 0.2–1.2)
Total Protein: 7.3 g/dL (ref 6.1–8.1)
eGFR: 70 mL/min/{1.73_m2}

## 2023-03-08 LAB — HEMOGLOBIN A1C
Hgb A1c MFr Bld: 5.7 %{Hb} — ABNORMAL HIGH (ref <5.7)
Mean Plasma Glucose: 117 mg/dL
eAG (mmol/L): 6.5 mmol/L

## 2023-03-09 ENCOUNTER — Encounter: Payer: Self-pay | Admitting: Dermatology

## 2023-03-13 ENCOUNTER — Encounter: Payer: Self-pay | Admitting: Internal Medicine

## 2023-03-13 MED ORDER — ATORVASTATIN CALCIUM 20 MG PO TABS: 20.0000 mg | ORAL_TABLET | ORAL | 1 refills | Status: DC

## 2023-05-05 ENCOUNTER — Telehealth: Payer: 59 | Admitting: Family Medicine

## 2023-05-05 DIAGNOSIS — B9689 Other specified bacterial agents as the cause of diseases classified elsewhere: Secondary | ICD-10-CM

## 2023-05-05 DIAGNOSIS — J019 Acute sinusitis, unspecified: Secondary | ICD-10-CM

## 2023-05-05 MED ORDER — DOXYCYCLINE HYCLATE 100 MG PO TABS
100.0000 mg | ORAL_TABLET | Freq: Two times a day (BID) | ORAL | 0 refills | Status: AC
Start: 2023-05-05 — End: 2023-05-12

## 2023-05-05 NOTE — Progress Notes (Signed)
 Virtual Visit Consent   Jamie Stanley, you are scheduled for a virtual visit with a [place] provider today. Just as with appointments in the office, your consent must be obtained to participate. Your consent will be active for this visit and any virtual visit you may have with one of our providers in the next 365 days. If you have a MyChart account, a copy of this consent can be sent to you electronically.  As this is a virtual visit, video technology does not allow for your provider to perform a traditional examination. This may limit your provider's ability to fully assess your condition. If your provider identifies any concerns that need to be evaluated in person or the need to arrange testing (such as labs, EKG, etc.), we will make arrangements to do so. Although advances in technology are sophisticated, we cannot ensure that it will always work on either your end or our end. If the connection with a video visit is poor, the visit may have to be switched to a telephone visit. With either a video or telephone visit, we are not always able to ensure that we have a secure connection.  By engaging in this virtual visit, you consent to the provision of healthcare and authorize for your insurance to be billed (if applicable) for the services provided during this visit. Depending on your insurance coverage, you may receive a charge related to this service.  I need to obtain your verbal consent now. Are you willing to proceed with your visit today? Jamie Stanley has provided verbal consent on 05/05/2023 for a virtual visit (video or telephone). Jamie CHRISTELLA Barefoot, NP  Date: 05/05/2023 11:17 AM  Virtual Visit via Video Note   I, Jamie Stanley, connected with  Jamie Stanley  ([id number], [id number]) on 05/05/23 at 11:15 AM EST by a video-enabled telemedicine application and verified that I am speaking with the correct person using two identifiers.  Location: Patient: Virtual Visit Location Patient: Home Provider:  Virtual Visit Location Provider: Home Office   I discussed the limitations of evaluation and management by telemedicine and the availability of in person appointments. The patient expressed understanding and agreed to proceed.    History of Present Illness: Jamie Stanley is a 53 y.o. who identifies as a female who was assigned female at birth, and is being seen today for URI   Onset was a week ago runny nose  Associated symptoms are temps of 99-100, congestion, cough, sneezing, headaches, chills, stuffy, loss of taste and smell Modifying factors are cold and sinus  Denies chest pain, shortness of breath, fevers, chills  Exposure to sick contacts-  unknown COVID test: no  Vaccines: Flu up to date  Does not get COVID vaccine due to getting RA started after the first series.   Problems:  Patient Active Problem List   Diagnosis Date Noted   Abdominal pain, chronic, right lower quadrant 03/07/2023   Prediabetes 03/07/2023   Overweight with body mass index (BMI) of 28 to 28.9 in adult 08/01/2021   Anxiety 07/11/2020   Rheumatoid arthritis of multiple sites with negative rheumatoid factor (HCC) 12/09/2019   Statin intolerance 07/03/2018   HLD (hyperlipidemia) 06/23/2015    Allergies:  Allergies  Allergen Reactions   Cinnamon Anaphylaxis   Penicillins Rash    Rash    Demerol [Meperidine] Nausea Only    Nausea     Medications:  Current Outpatient Medications:    acetaminophen (TYLENOL) 325 MG tablet, Take 650 mg by mouth every  6 (six) hours as needed., Disp: , Rfl:    atorvastatin  (LIPITOR) 20 MG tablet, Take 1 tablet (20 mg total) by mouth 2 (two) times a week., Disp: 24 tablet, Rfl: 1   cyclobenzaprine  (FLEXERIL ) 10 MG tablet, Take by mouth as needed., Disp: , Rfl:    Efinaconazole  10 % SOLN, Apply 1 application topically daily., Disp: 8 mL, Rfl: 2   EPINEPHrine  0.3 mg/0.3 mL IJ SOAJ injection, Inject 0.3 mg into the muscle as needed for anaphylaxis., Disp: 1 each, Rfl: 1    escitalopram  (LEXAPRO ) 10 MG tablet, TAKE 1 TABLET BY MOUTH EVERY DAY, Disp: 90 tablet, Rfl: 0   loratadine (CLARITIN) 10 MG tablet, Take 10 mg by mouth daily. As needed., Disp: , Rfl:    meloxicam  (MOBIC ) 7.5 MG tablet, Take 1 tablet (7.5 mg total) by mouth as needed., Disp: 90 tablet, Rfl: 1   Tavaborole  (KERYDIN ) 5 % SOLN, Apply to toenail Monday, Wednesday, Friday at bedtime, Disp: 4 mL, Rfl: 2  Observations/Objective: Patient is well-developed, well-nourished in no acute distress.  Resting comfortably  at home.  Head is normocephalic, atraumatic.  No labored breathing.  Speech is clear and coherent with logical content.  Patient is alert and oriented at baseline.    Assessment and Plan:  1. Acute bacterial sinusitis (Primary)  - doxycycline  (VIBRA -TABS) 100 MG tablet; Take 1 tablet (100 mg total) by mouth 2 (two) times daily for 7 days.  Dispense: 14 tablet; Refill: 0  - Increased rest - Increasing Fluids - Acetaminophen / ibuprofen as needed for fever/pain.  - Salt water gargling, chloraseptic spray and throat lozenges - Mucinex if mucus is present and increasing.  - Saline nasal spray if congestion or if nasal passages feel dry. - Humidifying the air.    Reviewed side effects, risks and benefits of medication.    Patient acknowledged agreement and understanding of the plan.   Past Medical, Surgical, Social History, Allergies, and Medications have been Reviewed.    Follow Up Instructions: I discussed the assessment and treatment plan with the patient. The patient was provided an opportunity to ask questions and all were answered. The patient agreed with the plan and demonstrated an understanding of the instructions.  A copy of instructions were sent to the patient via MyChart unless otherwise noted below.     The patient was advised to call back or seek an in-person evaluation if the symptoms worsen or if the condition fails to improve as anticipated.    Jamie CHRISTELLA Barefoot, NP

## 2023-05-05 NOTE — Patient Instructions (Signed)
 Clarita Gainer, thank you for joining [name], NP for today's virtual visit.  While this provider is not your primary care provider (PCP), if your PCP is located in our provider database this encounter information will be shared with them immediately following your visit.   A [facility] MyChart account gives you access to today's visit and all your visits, tests, and labs performed at [facility]  click here if you don't have a [facility] MyChart account or go to [email or web address]  Consent: (Patient) Jamie Stanley provided verbal consent for this virtual visit at the beginning of the encounter.  Current Medications:  Current Outpatient Medications:    doxycycline  (VIBRA -TABS) 100 MG tablet, Take 1 tablet (100 mg total) by mouth 2 (two) times daily for 7 days., Disp: 14 tablet, Rfl: 0   acetaminophen (TYLENOL) 325 MG tablet, Take 650 mg by mouth every 6 (six) hours as needed., Disp: , Rfl:    atorvastatin  (LIPITOR) 20 MG tablet, Take 1 tablet (20 mg total) by mouth 2 (two) times a week., Disp: 24 tablet, Rfl: 1   cyclobenzaprine  (FLEXERIL ) 10 MG tablet, Take by mouth as needed., Disp: , Rfl:    Efinaconazole  10 % SOLN, Apply 1 application topically daily., Disp: 8 mL, Rfl: 2   EPINEPHrine  0.3 mg/0.3 mL IJ SOAJ injection, Inject 0.3 mg into the muscle as needed for anaphylaxis., Disp: 1 each, Rfl: 1   escitalopram  (LEXAPRO ) 10 MG tablet, TAKE 1 TABLET BY MOUTH EVERY DAY, Disp: 90 tablet, Rfl: 0   loratadine (CLARITIN) 10 MG tablet, Take 10 mg by mouth daily. As needed., Disp: , Rfl:    meloxicam  (MOBIC ) 7.5 MG tablet, Take 1 tablet (7.5 mg total) by mouth as needed., Disp: 90 tablet, Rfl: 1   Tavaborole  (KERYDIN ) 5 % SOLN, Apply to toenail Monday, Wednesday, Friday at bedtime, Disp: 4 mL, Rfl: 2   Medications ordered in this encounter:  Meds ordered this encounter  Medications   doxycycline  (VIBRA -TABS) 100 MG tablet    Sig: Take 1 tablet (100 mg total) by mouth 2  (two) times daily for 7 days.    Dispense:  14 tablet    Refill:  0    Supervising Provider:   BLAISE ALEENE KIDD [[id number]]     *If you need refills on other medications prior to your next appointment, please contact your pharmacy*  Follow-Up: Call back or seek an in-person evaluation if the symptoms worsen or if the condition fails to improve as anticipated.  [facility] [phone]  Other Instructions  URI recommendations: - Increased rest - Increasing Fluids - Acetaminophen / ibuprofen as needed for fever/pain.  - Salt water gargling, chloraseptic spray and throat lozenges - Mucinex if mucus is present and increasing.  - Saline nasal spray if congestion or if nasal passages feel dry. - Humidifying the air.     If you have been instructed to have an in-person evaluation today at a local Urgent Care facility, please use the link below. It will take you to a list of all of our available [facility], including address, phone number and hours of operation. Please do not delay care.  [facility]  If you or a family member do not have a primary care provider, use the link below to schedule a visit and establish care. When you choose a [facility] primary care physician or advanced practice provider, you gain a long-term partner in health. Find a Primary Care  Provider  Learn more about [facility]'s in-office and virtual care options: [place] - Get Care Now

## 2023-05-16 ENCOUNTER — Other Ambulatory Visit: Payer: Self-pay | Admitting: Internal Medicine

## 2023-05-16 NOTE — Telephone Encounter (Signed)
Requested Prescriptions  Pending Prescriptions Disp Refills   escitalopram (LEXAPRO) 10 MG tablet [Pharmacy Med Name: ESCITALOPRAM 10 MG TABLET] 90 tablet 0    Sig: TAKE 1 TABLET BY MOUTH EVERY DAY     Psychiatry:  Antidepressants - SSRI Passed - 05/16/2023  9:20 AM      Passed - Valid encounter within last 6 months    Recent Outpatient Visits           2 months ago Pure hypercholesterolemia   [place] [place], Salvadore Oxford, NP   8 months ago Encounter for general adult medical examination with abnormal findings   [place] [facility] [place], Salvadore Oxford, NP   1 year ago Rheumatoid arthritis of multiple sites with negative rheumatoid factor [facility])   [place] [facility] [place], Salvadore Oxford, NP   1 year ago Encounter for general adult medical examination with abnormal findings   [facility] [place], [place] W, NP   2 years ago Rheumatoid arthritis of multiple sites with negative rheumatoid factor [facility])   [facility] [place], Salvadore Oxford, NP       Future Appointments             In 3 months Baity, Salvadore Oxford, NP [facility] [place]   In 4 months Deirdre Evener, MD [facility]

## 2023-08-17 ENCOUNTER — Other Ambulatory Visit: Payer: Self-pay | Admitting: Internal Medicine

## 2023-08-18 NOTE — Telephone Encounter (Signed)
 Requested Prescriptions  Pending Prescriptions Disp Refills   escitalopram  (LEXAPRO ) 10 MG tablet [Pharmacy Med Name: ESCITALOPRAM  10 MG TABLET] 90 tablet 0    Sig: TAKE 1 TABLET BY MOUTH EVERY DAY     Psychiatry:  Antidepressants - SSRI Failed - 08/18/2023  2:28 PM      Failed - Valid encounter within last 6 months    Recent Outpatient Visits   None     Future Appointments             In 2 weeks Baity, Rankin Buzzard, NP [facility]   In 1 month Elta Halter, MD [facility]

## 2023-09-05 ENCOUNTER — Encounter: Payer: Self-pay | Admitting: Internal Medicine

## 2023-09-05 ENCOUNTER — Ambulatory Visit (INDEPENDENT_AMBULATORY_CARE_PROVIDER_SITE_OTHER): Payer: Self-pay | Admitting: Internal Medicine

## 2023-09-05 VITALS — BP 110/72 | Ht 62.0 in | Wt 160.4 lb

## 2023-09-05 DIAGNOSIS — Z0001 Encounter for general adult medical examination with abnormal findings: Secondary | ICD-10-CM | POA: Diagnosis not present

## 2023-09-05 DIAGNOSIS — Z1231 Encounter for screening mammogram for malignant neoplasm of breast: Secondary | ICD-10-CM | POA: Diagnosis not present

## 2023-09-05 DIAGNOSIS — E7800 Pure hypercholesterolemia, unspecified: Secondary | ICD-10-CM | POA: Diagnosis not present

## 2023-09-05 DIAGNOSIS — R7303 Prediabetes: Secondary | ICD-10-CM | POA: Diagnosis not present

## 2023-09-05 DIAGNOSIS — Z6829 Body mass index (BMI) 29.0-29.9, adult: Secondary | ICD-10-CM

## 2023-09-05 DIAGNOSIS — E663 Overweight: Secondary | ICD-10-CM

## 2023-09-05 MED ORDER — EPINEPHRINE 0.3 MG/0.3ML IJ SOAJ
0.3000 mg | INTRAMUSCULAR | 1 refills | Status: AC | PRN
Start: 2023-09-05 — End: ?

## 2023-09-05 MED ORDER — ESCITALOPRAM OXALATE 10 MG PO TABS: 10.0000 mg | ORAL_TABLET | Freq: Every day | ORAL | 1 refills | Status: DC

## 2023-09-05 MED ORDER — ATORVASTATIN CALCIUM 20 MG PO TABS: 20.0000 mg | ORAL_TABLET | ORAL | 1 refills | Status: DC

## 2023-09-05 NOTE — Progress Notes (Signed)
 Subjective:    Patient ID: Jamie Stanley, female    DOB: 1971/01/09, 53 y.o.   MRN: [id number]  HPI  Patient presents to clinic today for her annual exam.   Flu: 01/2023 Tetanus: 12/2016 COVID: x 3 Shingrix: never Pap smear: 10/2022 Mammogram: 08/2022 Colon screening: 08/2022, Cologuard Vision screening: as needed Dentist: biannually  Diet: She does eat meat. She consumes fruits and veggies. She does eat some fried foods. She drinks mostly unsweet tea and water. 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     atorvastatin  (LIPITOR) 20 MG tablet Take 1 tablet (20 mg total) by mouth 2 (two) times a week. 24 tablet 1   cyclobenzaprine (FLEXERIL) 10 MG tablet Take by mouth as needed.     Efinaconazole  10 % SOLN Apply 1 application topically daily. 8 mL 2   EPINEPHrine  0.3 mg/0.3 mL IJ SOAJ injection Inject 0.3 mg into the muscle as needed for anaphylaxis. 1 each 1   escitalopram  (LEXAPRO ) 10 MG tablet TAKE 1 TABLET BY MOUTH EVERY DAY 90 tablet 0   loratadine (CLARITIN) 10 MG tablet Take 10 mg by mouth daily. As needed.     meloxicam  (MOBIC ) 7.5 MG tablet Take 1 tablet (7.5 mg total) by mouth as needed. 90 tablet 1   Tavaborole  (KERYDIN ) 5 % SOLN Apply to toenail Monday, Wednesday, Friday at bedtime 4 mL 2   No current  facility-administered medications for this visit.    Allergies  Allergen Reactions   Cinnamon Anaphylaxis   Penicillins Rash    Rash    Demerol [Meperidine] Nausea Only    Nausea      Family History  Problem Relation Age of Onset   Hypertension Mother    Hyperlipidemia Mother    Breast cancer Mother 19       ER/PR type BRCA Neg   AAA (abdominal aortic aneurysm) Mother    Hypothyroidism Mother    Hypertension Father    Hyperlipidemia Father    Glaucoma Father    Heart disease Paternal Grandfather 45   Diabetes Paternal Grandfather    Ovarian cancer Maternal Grandmother 1   Hypothyroidism Maternal Grandmother    Hypothyroidism Sister    Hypothyroidism Paternal Grandmother    Diabetes Paternal Grandmother    Breast cancer Other 42   Breast cancer Other 85   Hypothyroidism Maternal Aunt     Social History   Socioeconomic History   Marital status: Married    Spouse name: Not on file   Number of children: Not on file   Years of education: Not on file   Highest education level: Bachelor's degree (e.g., BA, AB, BS)  Occupational History  Not on file  Tobacco Use   Smoking status: Never   Smokeless tobacco: Never  Vaping Use   Vaping status: Never Used  Substance and Sexual Activity   Alcohol use: Yes    Comment: occasional   Drug use: Never   Sexual activity: Yes    Birth control/protection: None  Other Topics Concern   Not on file  Social History Narrative   Not on file   Social Drivers of Health   Financial Resource Strain: Low Risk  (09/04/2023)   Overall Financial Resource Strain (CARDIA)    Difficulty of Paying Living Expenses: Not hard at all  Food Insecurity: No Food Insecurity (09/04/2023)   Hunger Vital Sign    Worried About Running Out of Food in the Last Year: Never true    Ran Out of Food in the Last Year: Never true  Transportation Needs: No Transportation Needs (09/04/2023)   PRAPARE - Administrator, Civil Service (Medical): No     Lack of Transportation (Non-Medical): No  Physical Activity: Insufficiently Active (09/04/2023)   Exercise Vital Sign    Days of Exercise per Week: 3 days    Minutes of Exercise per Session: 30 min  Stress: No Stress Concern Present (09/04/2023)   [employer] of Occupational Health - Occupational Stress Questionnaire    Feeling of Stress : Only a little  Social Connections: Socially Integrated (09/04/2023)   Social Connection and Isolation Panel [NHANES]    Frequency of Communication with Friends and Family: More than three times a week    Frequency of Social Gatherings with Friends and Family: Twice a week    Attends Religious Services: More than 4 times per year    Active Member of [employer] or Organizations: Yes    Attends Banker Meetings: 1 to 4 times per year    Marital Status: Married  Catering manager Violence: Not At Risk (05/14/2017)   Humiliation, Afraid, Rape, and Kick questionnaire    Fear of Current or Ex-Partner: No    Emotionally Abused: No    Physically Abused: No    Sexually Abused: No     Constitutional: Denies fever, malaise, fatigue, headache or abrupt weight changes.  HEENT: Denies eye pain, eye redness, ear pain, ringing in the ears, wax buildup, runny nose, nasal congestion, bloody nose, or sore throat. Respiratory: Denies difficulty breathing, shortness of breath, cough or sputum production.   Cardiovascular: Denies chest pain, chest tightness, palpitations or swelling in the hands or feet.  Gastrointestinal: Denies abdominal pain, bloating, constipation, diarrhea or blood in the stool.  GU: Denies urgency, frequency, pain with urination, burning sensation, blood in urine, odor or discharge. Musculoskeletal: Pt reports joint pain. Denies decrease in range of motion, difficulty with gait, muscle pain.  Skin: Denies redness, rashes, lesions or ulcercations.  Neurological: Denies dizziness, difficulty with memory, difficulty with speech or problems  with balance and coordination.  Psych: Denies anxiety, depression, SI/HI.  No other specific complaints in a complete review of systems (except as listed in HPI above).  Objective:   Physical Exam  BP 110/72 (BP Location: Left Arm, Patient Position: Sitting, Cuff Size: Normal)   Ht 5\' 2"  (1.575 m)   Wt 160 lb 6.4 oz (72.8 kg)   LMP 12/31/2018   BMI 29.34 kg/m   Wt Readings from Last 3 Encounters:  03/07/23 154 lb 6.4 oz (70 kg)  11/07/22 158 lb (71.7 kg)  08/30/22 157 lb (71.2 kg)    General: Appears  her stated age, overweight, in NAD. Skin: Warm, dry and intact.  HEENT: Head: normal shape and size; Eyes: sclera white, no icterus, conjunctiva pink, PERRLA and EOMs intact;  Neck:  Neck supple, trachea midline. No masses, lumps or thyromegaly present.  Cardiovascular: Normal rate and rhythm. S1,S2 noted.  No murmur, rubs or gallops noted. No JVD or BLE edema. No carotid bruits noted. Pulmonary/Chest: Normal effort and positive vesicular breath sounds. No respiratory distress. No wheezes, rales or ronchi noted.  Abdomen: Normal bowel sounds.  Musculoskeletal: Strength 5/5 BUE/BLE.  No difficulty with gait.  Neurological: Alert and oriented. Cranial nerves II-XII grossly intact. Coordination normal.  Psychiatric: Mood and affect normal. Behavior is normal. Judgment and thought content normal.    BMET    Component Value Date/Time   NA 140 03/07/2023 0917   NA 140 07/02/2019 0000   K 4.3 03/07/2023 0917   CL 104 03/07/2023 0917   CO2 28 03/07/2023 0917   GLUCOSE 97 03/07/2023 0917   BUN 15 03/07/2023 0917   BUN 11 07/02/2019 0000   CREATININE 0.96 03/07/2023 0917   CALCIUM  9.6 03/07/2023 0917   GFRNONAA 83 07/02/2019 0000   GFRNONAA 69 01/06/2019 1151   GFRAA 96 07/02/2019 0000   GFRAA 80 01/06/2019 1151    Lipid Panel     Component Value Date/Time   CHOL 264 (H) 03/07/2023 0917   CHOL 188 07/02/2019 0000   TRIG 107 03/07/2023 0917   HDL 67 03/07/2023 0917   HDL  61 07/02/2019 0000   CHOLHDL 3.9 03/07/2023 0917   LDLCALC 174 (H) 03/07/2023 0917    CBC    Component Value Date/Time   WBC 6.1 08/30/2022 0911   RBC 4.35 08/30/2022 0911   HGB 12.8 08/30/2022 0911   HGB 13.9 07/02/2019 0000   HCT 39.0 08/30/2022 0911   HCT 41.7 07/02/2019 0000   PLT 213 08/30/2022 0911   PLT 214 07/02/2019 0000   MCV 89.7 08/30/2022 0911   MCV 92 07/02/2019 0000   MCH 29.4 08/30/2022 0911   MCHC 32.8 08/30/2022 0911   RDW 12.7 08/30/2022 0911   RDW 12.3 07/02/2019 0000   LYMPHSABS 2,503 07/11/2020 1102   LYMPHSABS 3.0 07/02/2019 0000   EOSABS 143 07/11/2020 1102   EOSABS 0.1 07/02/2019 0000   BASOSABS 22 07/11/2020 1102   BASOSABS 0.0 07/02/2019 0000    Hgb A1C Lab Results  Component Value Date   HGBA1C 5.7 (H) 03/07/2023           Assessment & Plan:   Preventative Health Maintenance:  Encouraged her to get a flu shot in the fall Tetanus UTD Encouraged her to get her covid booster.  Discussed shingrix vaccine, she will check coverage with her insurance company and schedule a visit if she would like to have this done Pap smear UTD Mammogram ordered-she will call to schedule Colon screening UTD Encouraged her to consume a balanced diet and exercise regimen Advised her to see an eye doctor and dentist annually We will check CBC, c-Met, lipid, A1c today   RTC in 6 months, follow-up chronic conditions Helayne Lo, NP

## 2023-09-05 NOTE — Assessment & Plan Note (Signed)
 Encouraged diet and exercise for weight loss ?

## 2023-09-05 NOTE — Patient Instructions (Signed)

## 2023-09-06 ENCOUNTER — Encounter: Payer: Self-pay | Admitting: Internal Medicine

## 2023-09-06 LAB — COMPREHENSIVE METABOLIC PANEL WITH GFR
AG Ratio: 1.9 (calc) (ref 1.0–2.5)
ALT: 16 U/L (ref 6–29)
AST: 19 U/L (ref 10–35)
Albumin: 4.4 g/dL (ref 3.6–5.1)
Alkaline phosphatase (APISO): 84 U/L (ref 37–153)
BUN: 16 mg/dL (ref 7–25)
CO2: 29 mmol/L (ref 20–32)
Calcium: 9.2 mg/dL (ref 8.6–10.4)
Chloride: 105 mmol/L (ref 98–110)
Creat: 0.77 mg/dL (ref 0.50–1.03)
Globulin: 2.3 g/dL (ref 1.9–3.7)
Glucose, Bld: 94 mg/dL (ref 65–99)
Potassium: 4.4 mmol/L (ref 3.5–5.3)
Sodium: 141 mmol/L (ref 135–146)
Total Bilirubin: 0.5 mg/dL (ref 0.2–1.2)
Total Protein: 6.7 g/dL (ref 6.1–8.1)
eGFR: 91 mL/min/{1.73_m2} (ref >=60)

## 2023-09-06 LAB — LIPID PANEL
Cholesterol: 187 mg/dL (ref <200)
HDL: 63 mg/dL (ref >=50)
LDL Cholesterol (Calc): 107 mg/dL — ABNORMAL HIGH
Non-HDL Cholesterol (Calc): 124 mg/dL (ref <130)
Total CHOL/HDL Ratio: 3 (calc) (ref <5.0)
Triglycerides: 84 mg/dL (ref <150)

## 2023-09-06 LAB — CBC
HCT: 39.3 % (ref 35.0–45.0)
Hemoglobin: 12.8 g/dL (ref 11.7–15.5)
MCH: 29.6 pg (ref 27.0–33.0)
MCHC: 32.6 g/dL (ref 32.0–36.0)
MCV: 90.8 fL (ref 80.0–100.0)
MPV: 10.4 fL (ref 7.5–12.5)
Platelets: 195 10*3/uL (ref 140–400)
RBC: 4.33 10*6/uL (ref 3.80–5.10)
RDW: 12.4 % (ref 11.0–15.0)
WBC: 5.8 10*3/uL (ref 3.8–10.8)

## 2023-09-06 LAB — HEMOGLOBIN A1C
Hgb A1c MFr Bld: 5.7 % — ABNORMAL HIGH (ref <5.7)
Mean Plasma Glucose: 117 mg/dL
eAG (mmol/L): 6.5 mmol/L

## 2023-10-01 ENCOUNTER — Ambulatory Visit: Payer: BC Managed Care – PPO | Admitting: Dermatology

## 2023-10-14 ENCOUNTER — Ambulatory Visit: Admitting: Dermatology

## 2023-10-14 ENCOUNTER — Encounter: Payer: Self-pay | Admitting: Dermatology

## 2023-10-14 DIAGNOSIS — W908XXA Exposure to other nonionizing radiation, initial encounter: Secondary | ICD-10-CM | POA: Diagnosis not present

## 2023-10-14 DIAGNOSIS — L821 Other seborrheic keratosis: Secondary | ICD-10-CM

## 2023-10-14 DIAGNOSIS — D1801 Hemangioma of skin and subcutaneous tissue: Secondary | ICD-10-CM

## 2023-10-14 DIAGNOSIS — Z1283 Encounter for screening for malignant neoplasm of skin: Secondary | ICD-10-CM

## 2023-10-14 DIAGNOSIS — B070 Plantar wart: Secondary | ICD-10-CM

## 2023-10-14 DIAGNOSIS — L814 Other melanin hyperpigmentation: Secondary | ICD-10-CM

## 2023-10-14 DIAGNOSIS — L578 Other skin changes due to chronic exposure to nonionizing radiation: Secondary | ICD-10-CM

## 2023-10-14 DIAGNOSIS — L910 Hypertrophic scar: Secondary | ICD-10-CM | POA: Diagnosis not present

## 2023-10-14 DIAGNOSIS — D229 Melanocytic nevi, unspecified: Secondary | ICD-10-CM

## 2023-10-14 DIAGNOSIS — Z7189 Other specified counseling: Secondary | ICD-10-CM

## 2023-10-14 DIAGNOSIS — Z79899 Other long term (current) drug therapy: Secondary | ICD-10-CM

## 2023-10-14 DIAGNOSIS — Z86018 Personal history of other benign neoplasm: Secondary | ICD-10-CM

## 2023-10-14 MED ORDER — TRIAMCINOLONE ACETONIDE 10 MG/ML IJ SUSP
2.5000 mg | Freq: Once | INTRAMUSCULAR | Status: AC
  Administered 2023-10-14: 2.5 mg

## 2023-10-14 MED ORDER — FLUOROURACIL 5 % EX CREA: TOPICAL_CREAM | Freq: Every day | CUTANEOUS | 3 refills | Status: DC

## 2023-10-14 NOTE — Progress Notes (Signed)
 Follow-Up Visit   Subjective  Jamie Stanley is a 53 y.o. female who presents for the following: Skin Cancer Screening and Full Body Skin Exam hx of Dysplastic Nevus, check spot back, ~37yrs, itchy, warts R plantar foot, not sure how long she has had, no treatment  The patient presents for Total-Body Skin Exam (TBSE) for skin cancer screening and mole check. The patient has spots, moles and lesions to be evaluated, some may be new or changing and the patient may have concern these could be cancer.  The following portions of the chart were reviewed this encounter and updated as appropriate: medications, allergies, medical history  Review of Systems:  No other skin or systemic complaints except as noted in HPI or Assessment and Plan.  Objective  Well appearing patient in no apparent distress; mood and affect are within normal limits.  A full examination was performed including scalp, head, eyes, ears, nose, lips, neck, chest, axillae, abdomen, back, buttocks, bilateral upper extremities, bilateral lower extremities, hands, feet, fingers, toes, fingernails, and toenails. All findings within normal limits unless otherwise noted below.   Relevant physical exam findings are noted in the Assessment and Plan.  R plantar foot x 2 (2) Verrucous papules -- Discussed viral etiology and contagion.  L back infrascapular Hypertrophic scar  Assessment & Plan   SKIN CANCER SCREENING PERFORMED TODAY.  ACTINIC DAMAGE - Chronic condition, secondary to cumulative UV/sun exposure - diffuse scaly erythematous macules with underlying dyspigmentation - Recommend daily broad spectrum sunscreen SPF 30+ to sun-exposed areas, reapply every 2 hours as needed.  - Staying in the shade or wearing long sleeves, sun glasses (UVA+UVB protection) and wide brim hats (4-inch brim around the entire circumference of the hat) are also recommended for sun protection.  - Call for new or changing lesions.  LENTIGINES,  SEBORRHEIC KERATOSES, HEMANGIOMAS - Benign normal skin lesions - Benign-appearing - Call for any changes  MELANOCYTIC NEVI - Tan-brown and/or pink-flesh-colored symmetric macules and papules - Benign appearing on exam today - Observation - Call clinic for new or changing moles - Recommend daily use of broad spectrum spf 30+ sunscreen to sun-exposed areas.   HISTORY OF DYSPLASTIC NEVUS with HYPERTROPHIC SCAR No evidence of recurrence today Recommend regular full body skin exams Recommend daily broad spectrum sunscreen SPF 30+ to sun-exposed areas, reapply every 2 hours as needed.  Call if any new or changing lesions are noted between office visits  -L back infrascapular 7.0cm lat to spine  PLANTAR WART (2) R plantar foot x 2 (2) Viral Wart (HPV) Counseling  Discussed viral / HPV (Human Papilloma Virus) etiology and risk of spread /infectivity to other areas of body as well as to other people.  Multiple treatments and methods may be required to clear warts and it is possible treatment may not be successful.  Treatment risks include discoloration; scarring and there is still potential for wart recurrence.  LN2 x 2 Start SM Wart Cream qhs to aa warts on foot Destruction of lesion - R plantar foot x 2 (2) Complexity: simple   Destruction method: cryotherapy   Informed consent: discussed and consent obtained   Timeout:  patient name, date of birth, surgical site, and procedure verified Lesion destroyed using liquid nitrogen: Yes   Region frozen until ice ball extended beyond lesion: Yes   Outcome: patient tolerated procedure well with no complications   Post-procedure details: wound care instructions given   HYPERTROPHIC SCAR L back infrascapular Scar secondary to Dysplastic Nevus shave removal  Intralesional injection - L back infrascapular Location: L back infrascapular  Informed Consent: Discussed risks (infection, pain, bleeding, bruising, thinning of the skin, loss of skin  pigment, lack of resolution, and recurrence of lesion) and benefits of the procedure, as well as the alternatives. Informed consent was obtained. Preparation: The area was prepared a standard fashion.  Procedure Details: An intralesional injection was performed with Kenalog  2.5 mg/cc. 0.2 cc in total were injected.  Total number of injections: 2  Plan: The patient was instructed on post-op care. Recommend OTC analgesia as needed for pain.  Lot [id number] Exp 09/2025 Related Medications triamcinolone  acetonide (KENALOG ) 10 MG/ML injection 2.5 mg  Return in about 1 year (around 10/13/2024) for TBSE, Hx of Dysplastic nevi.  I, Rollie Clipper, RMA, am acting as scribe for Celine Collard, MD .   Documentation: I have reviewed the above documentation for accuracy and completeness, and I agree with the above.  Celine Collard, MD

## 2023-10-14 NOTE — Patient Instructions (Addendum)
 Apply wart paste to warts at bedtime, leave on overnight, wash off in the morning, apply duct tap over warts and then take tape off at bedtime before you reapply wart paste, you will do this daily  Instructions for [facility] Medications  One or more of your medications was sent to the [facility] mail order compounding pharmacy. You will receive an email from them and can purchase the medicine through that link. It will then be mailed to your home at the address you confirmed. If for any reason you do not receive an email from them, please check your spam folder. If you still do not find the email, please let us  know. [facility] phone number is [phone].     Due to recent changes in healthcare laws, you may see results of your pathology and/or laboratory studies on MyChart before the doctors have had a chance to review them. We understand that in some cases there may be results that are confusing or concerning to you. Please understand that not all results are received at the same time and often the doctors may need to interpret multiple results in order to provide you with the best plan of care or course of treatment. Therefore, we ask that you please give us  2 business days to thoroughly review all your results before contacting the office for clarification. Should we see a critical lab result, you will be contacted sooner.   If You Need Anything After Your Visit  If you have any questions or concerns for your doctor, please call our main line at [phone] and press option 4 to reach your doctor's medical assistant. If no one answers, please leave a voicemail as directed and we will return your call as soon as possible. Messages left after 4 pm will be answered the following business day.   You may also send us  a message via MyChart. We typically respond to MyChart messages within 1-2 business days.  For prescription refills, please ask your pharmacy to contact our office.  Our fax number is [phone].  If you have an urgent issue when the clinic is closed that cannot wait until the next business day, you can page your doctor at the number below.    Please note that while we do our best to be available for urgent issues outside of office hours, we are not available 24/7.   If you have an urgent issue and are unable to reach us , you may choose to seek medical care at your doctor's office, retail clinic, urgent care center, or emergency room.  If you have a medical emergency, please immediately call 911 or go to the emergency department.  Pager Numbers  - Dr. Bary Likes: [phone]  - Dr. Annette Barters: [phone]  - Dr. Felipe Horton: [phone]   In the event of inclement weather, please call our main line at [phone] for an update on the status of any delays or closures.  Dermatology Medication Tips: Please keep the boxes that topical medications come in in order to help keep track of the instructions about where and how to use these. Pharmacies typically print the medication instructions only on the boxes and not directly on the medication tubes.   If your medication is too expensive, please contact our office at [phone] option 4 or send us  a message through MyChart.   We are unable to tell what your co-pay for medications will be in advance as this is different depending on your insurance coverage. However, we may be  able to find a substitute medication at lower cost or fill out paperwork to get insurance to cover a needed medication.   If a prior authorization is required to get your medication covered by your insurance company, please allow us  1-2 business days to complete this process.  Drug prices often vary depending on where the prescription is filled and some pharmacies may offer cheaper prices.  The website www.goodrx.com contains coupons for medications through different pharmacies. The prices here do not account for what the cost may be  with help from insurance (it may be cheaper with your insurance), but the website can give you the price if you did not use any insurance.  - You can print the associated coupon and take it with your prescription to the pharmacy.  - You may also stop by our office during regular business hours and pick up a GoodRx coupon card.  - If you need your prescription sent electronically to a different pharmacy, notify our office through [facility] or by phone at [phone] option 4.     Si Usted Necesita Algo Despus de Su Visita  Tambin puede enviarnos un mensaje a travs de Clinical cytogeneticist. Por lo general respondemos a los mensajes de MyChart en el transcurso de 1 a 2 das hbiles.  Para renovar recetas, por favor pida a su farmacia que se ponga en contacto con nuestra oficina. Franz Jacks de fax es [place] [phone].  Si tiene un asunto urgente cuando la clnica est cerrada y que no puede esperar hasta el siguiente da hbil, puede llamar/localizar a su doctor(a) al nmero que aparece a continuacin.   Por favor, tenga en cuenta que aunque hacemos todo lo posible para estar disponibles para asuntos urgentes fuera del horario de [place], no estamos disponibles las 24 horas del da, los 7 [address]  [address] de la [place].   Si tiene un problema urgente y no puede comunicarse con nosotros, puede optar por buscar atencin mdica  en el consultorio de su doctor(a), en una clnica privada, en un centro de atencin urgente o en una sala de emergencias.  Si tiene Engineer, drilling, por favor llame inmediatamente al 911 o vaya a la sala de emergencias.  Nmeros de bper  - Dr. Bary Likes: [phone]  - Dra. Annette Barters: [phone]  - Dr. Felipe Horton: [phone]   En caso de inclemencias del tiempo, por favor llame a Lajuan Pila principal al [phone] para una actualizacin sobre el [place] de cualquier retraso o cierre.  Consejos para la medicacin en dermatologa: Por favor, guarde las cajas en las que  vienen los medicamentos de uso tpico para ayudarle a seguir las instrucciones sobre dnde y cmo usarlos. Las farmacias generalmente imprimen las instrucciones del medicamento slo en las cajas y no directamente en los tubos del [place].   Si su medicamento es muy caro, por favor, pngase en contacto con Bettyjane Brunet llamando al [phone] y presione la opcin 4 o envenos un mensaje a travs de Clinical cytogeneticist.   No podemos decirle cul ser su copago por los medicamentos por adelantado ya que esto es diferente dependiendo de la cobertura de su seguro. Sin embargo, es posible que podamos encontrar un medicamento sustituto a Audiological scientist un formulario para que el seguro cubra el medicamento que se considera necesario.   Si se requiere una autorizacin previa para que su compaa de seguros [place] su medicamento, por favor permtanos de 1 a 2 das hbiles para completar este proceso.  Los precios de los medicamentos varan  con frecuencia dependiendo del lugar de dnde se surte la receta y alguna farmacias pueden ofrecer precios ms baratos.  El sitio web www.goodrx.com tiene cupones para medicamentos de Health and safety inspector. Los precios aqu no tienen en cuenta lo que podra costar con la ayuda del seguro (puede ser ms barato con su seguro), pero el sitio web puede darle el precio si no utiliz Tourist information centre manager.  - Puede imprimir el cupn correspondiente y llevarlo con su receta a la farmacia.  - Tambin puede pasar por nuestra oficina durante el horario de atencin regular y Education officer, museum una tarjeta de cupones de GoodRx.  - Si necesita que su receta se enve electrnicamente a una farmacia diferente, informe a nuestra oficina a travs de MyChart de [place] o por telfono llamando al [phone] y presione la opcin 4.

## 2023-10-16 ENCOUNTER — Ambulatory Visit
Admission: RE | Admit: 2023-10-16 | Discharge: 2023-10-16 | Disposition: A | Discharge status: Home / Self Care | Source: Ambulatory Visit | Attending: Internal Medicine | Admitting: Internal Medicine

## 2023-10-16 DIAGNOSIS — Z1231 Encounter for screening mammogram for malignant neoplasm of breast: Secondary | ICD-10-CM | POA: Diagnosis present

## 2024-02-04 ENCOUNTER — Encounter: Payer: Self-pay | Admitting: Obstetrics and Gynecology

## 2024-03-10 ENCOUNTER — Ambulatory Visit: Admitting: Internal Medicine

## 2024-03-10 ENCOUNTER — Encounter: Payer: Self-pay | Admitting: Internal Medicine

## 2024-03-10 VITALS — BP 108/70 | Ht 62.0 in | Wt 162.6 lb

## 2024-03-10 DIAGNOSIS — Z6829 Body mass index (BMI) 29.0-29.9, adult: Secondary | ICD-10-CM

## 2024-03-10 DIAGNOSIS — E7800 Pure hypercholesterolemia, unspecified: Secondary | ICD-10-CM

## 2024-03-10 DIAGNOSIS — R7303 Prediabetes: Secondary | ICD-10-CM | POA: Diagnosis not present

## 2024-03-10 DIAGNOSIS — E663 Overweight: Secondary | ICD-10-CM

## 2024-03-10 DIAGNOSIS — M0609 Rheumatoid arthritis without rheumatoid factor, multiple sites: Secondary | ICD-10-CM | POA: Diagnosis not present

## 2024-03-10 DIAGNOSIS — M5416 Radiculopathy, lumbar region: Secondary | ICD-10-CM | POA: Diagnosis not present

## 2024-03-10 DIAGNOSIS — F419 Anxiety disorder, unspecified: Secondary | ICD-10-CM

## 2024-03-10 MED ORDER — ESCITALOPRAM OXALATE 10 MG PO TABS: 10.0000 mg | ORAL_TABLET | Freq: Every day | ORAL | 1 refills | Status: AC

## 2024-03-10 MED ORDER — CYCLOBENZAPRINE HCL 10 MG PO TABS: 10.0000 mg | ORAL_TABLET | ORAL | 1 refills | Status: AC | PRN

## 2024-03-10 NOTE — Assessment & Plan Note (Signed)
 Continue escitalopram  10 mg daily Support offered

## 2024-03-10 NOTE — Progress Notes (Signed)
 Subjective:    Patient ID: Jamie Stanley, female    DOB: July 18, 1970, 53 y.o.   MRN: [id number]  HPI  Patient presents to clinic today for 7-month follow-up of chronic conditions.  Anxiety: Chronic, managed on escitalopram .  She is not seeing a therapist.  She denies depression, SI/HI.  RA:  She no longer follows with rheumatology.  HLD: Her last LDL was 107, triglycerides 84, 08/2023.  She denies myalgias on atorvastatin  (2 x week).  She has failed rosuvastatin , simvastatin  and ezetimibe  in the past due to myalgias.  She tries to consume a low-fat diet.  Prediabetes: Her last A1c was 5.7%, 08/2023.  She is not taking any oral diabetic medication at this time.  She does not check her sugars.  Lumbar radiculopathy: Intermittent. She has had injections in the past. There is no imagine on file. Managed with cyclobenzaprine, meloxicam  and tylenol only as needed. She no longer follows with orthopedics.  Review of Systems     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   Physiological ovarian cysts    Rheumatoid arthritis (HCC)    Screening for colon cancer 2021   Cologuard neg; repeat after 3 yrs    Current Outpatient Medications  Medication Sig Dispense Refill   acetaminophen (TYLENOL) 325 MG tablet Take 650 mg by mouth every 6 (six) hours as needed.     atorvastatin  (LIPITOR) 20 MG tablet Take 1 tablet (20 mg total) by mouth 2 (two) times a week. 24 tablet 1   cyclobenzaprine (FLEXERIL) 10 MG tablet Take by mouth as needed.     Efinaconazole  10 % SOLN Apply 1 application topically daily. 8 mL 2   EPINEPHrine  0.3 mg/0.3 mL IJ SOAJ injection Inject 0.3 mg into  the muscle as needed for anaphylaxis. 1 each 1   escitalopram  (LEXAPRO ) 10 MG tablet Take 1 tablet (10 mg total) by mouth daily. 90 tablet 1   fluorouracil  (EFUDEX ) 5 % cream Apply topically at bedtime. qhs to warts on feet 15 g 3   loratadine (CLARITIN) 10 MG tablet Take 10 mg by mouth daily. As needed.     meloxicam  (MOBIC ) 7.5 MG tablet Take 1 tablet (7.5 mg total) by mouth as needed. 90 tablet 1   Tavaborole  (KERYDIN ) 5 % SOLN Apply to toenail Monday, Wednesday, Friday at bedtime 4 mL 2   No current facility-administered medications for this visit.    Allergies  Allergen Reactions   Cinnamon Anaphylaxis   Penicillins Rash    Rash    Demerol [Meperidine] Nausea Only    Nausea      Family History  Problem Relation Age of Onset   Hypertension Mother    Hyperlipidemia Mother    Breast cancer Mother 57       ER/PR type BRCA Neg   AAA (abdominal aortic aneurysm) Mother    Hypothyroidism Mother    Hypertension Father    Hyperlipidemia Father    Glaucoma Father    Heart disease Paternal Grandfather 46   Diabetes Paternal Grandfather    Ovarian cancer  Maternal Grandmother 40   Hypothyroidism Maternal Grandmother    Hypothyroidism Sister    Hypothyroidism Paternal Grandmother    Diabetes Paternal Grandmother    Breast cancer Other 44   Breast cancer Other 66   Hypothyroidism Maternal Aunt     Social History   Socioeconomic History   Marital status: Married    Spouse name: Not on file   Number of children: Not on file   Years of education: Not on file   Highest education level: Bachelor's degree (e.g., BA, AB, BS)  Occupational History   Not on file  Tobacco Use   Smoking status: Never   Smokeless tobacco: Never  Vaping Use   Vaping status: Never Used  Substance and Sexual Activity   Alcohol use: Yes    Comment: occasional   Drug use: Never   Sexual activity: Yes    Birth control/protection: None  Other Topics Concern   Not on file  Social History Narrative    Not on file   Social Drivers of Health   Financial Resource Strain: Low Risk  (09/04/2023)   Overall Financial Resource Strain (CARDIA)    Difficulty of Paying Living Expenses: Not hard at all  Food Insecurity: No Food Insecurity (09/04/2023)   Hunger Vital Sign    Worried About Running Out of Food in the Last Year: Never true    Ran Out of Food in the Last Year: Never true  Transportation Needs: No Transportation Needs (09/04/2023)   PRAPARE - Administrator, Civil Service (Medical): No    Lack of Transportation (Non-Medical): No  Physical Activity: Insufficiently Active (09/04/2023)   Exercise Vital Sign    Days of Exercise per Week: 3 days    Minutes of Exercise per Session: 30 min  Stress: No Stress Concern Present (09/04/2023)   [employer] of Occupational Health - Occupational Stress Questionnaire    Feeling of Stress : Only a little  Social Connections: Socially Integrated (09/04/2023)   Social Connection and Isolation Panel    Frequency of Communication with Friends and Family: More than three times a week    Frequency of Social Gatherings with Friends and Family: Twice a week    Attends Religious Services: More than 4 times per year    Active Member of [employer] or Organizations: Yes    Attends Banker Meetings: 1 to 4 times per year    Marital Status: Married  Catering Manager Violence: Not At Risk (05/14/2017)   Humiliation, Afraid, Rape, and Kick questionnaire    Fear of Current or Ex-Partner: No    Emotionally Abused: No    Physically Abused: No    Sexually Abused: No     Constitutional: Denies fever, malaise, fatigue, headache or abrupt weight changes.  HEENT: Pt reports eye redness, crusting. Denies eye pain, eye redness, ear pain, ringing in the ears, wax buildup, runny nose, nasal congestion, bloody nose, or sore throat. Respiratory: Denies difficulty breathing, shortness of breath, cough or sputum production.   Cardiovascular: Denies chest  pain, chest tightness, palpitations or swelling in the hands or feet.  Gastrointestinal: Denies bloating, constipation, diarrhea or blood in the stool.  GU: Denies urgency, frequency, pain with urination, burning sensation, blood in urine, odor or discharge. Musculoskeletal: Patient reports joint pain.  Denies decrease in range of motion, difficulty with gait, muscle pain or joint swelling.  Skin: Denies redness, rashes, lesions or ulcercations.  Neurological: Denies dizziness, difficulty with memory, difficulty with speech or problems  with balance and coordination.  Psych: Patient has a history of anxiety.  Denies depression, SI/HI.  No other specific complaints in a complete review of systems (except as listed in HPI above).  Objective:   Physical Exam  BP 108/70 (BP Location: Left Arm, Patient Position: Sitting, Cuff Size: Normal)   Ht 5' 2 (1.575 m)   Wt 162 lb 9.6 oz (73.8 kg)   LMP 12/31/2018   BMI 29.74 kg/m    Wt Readings from Last 3 Encounters:  09/05/23 160 lb 6.4 oz (72.8 kg)  03/07/23 154 lb 6.4 oz (70 kg)  11/07/22 158 lb (71.7 kg)    General: Appears her stated age, overweight, in NAD. Skin: Warm, dry and intact.  HEENT: Head: normal shape and size; Eyes: sclera white, no icterus, conjunctiva pink, PERRLA and EOMs intact;  Cardiovascular: Normal rate and rhythm. S1,S2 noted.  No murmur, rubs or gallops noted. No JVD or BLE edema. No carotid bruits noted. Pulmonary/Chest: Normal effort and positive vesicular breath sounds. No respiratory distress. No wheezes, rales or ronchi noted.  Musculoskeletal: No difficulty with gait.  Neurological: Alert and oriented. Cranial nerves II-XII grossly intact. Coordination normal.  Psychiatric: Mood and affect normal. Behavior is normal. Judgment and thought content normal.    BMET    Component Value Date/Time   NA 141 09/05/2023 0858   NA 140 07/02/2019 0000   K 4.4 09/05/2023 0858   CL 105 09/05/2023 0858   CO2 29  09/05/2023 0858   GLUCOSE 94 09/05/2023 0858   BUN 16 09/05/2023 0858   BUN 11 07/02/2019 0000   CREATININE 0.77 09/05/2023 0858   CALCIUM  9.2 09/05/2023 0858   GFRNONAA 83 07/02/2019 0000   GFRNONAA 69 01/06/2019 1151   GFRAA 96 07/02/2019 0000   GFRAA 80 01/06/2019 1151    Lipid Panel     Component Value Date/Time   CHOL 187 09/05/2023 0858   CHOL 188 07/02/2019 0000   TRIG 84 09/05/2023 0858   HDL 63 09/05/2023 0858   HDL 61 07/02/2019 0000   CHOLHDL 3.0 09/05/2023 0858   LDLCALC 107 (H) 09/05/2023 0858    CBC    Component Value Date/Time   WBC 5.8 09/05/2023 0858   RBC 4.33 09/05/2023 0858   HGB 12.8 09/05/2023 0858   HGB 13.9 07/02/2019 0000   HCT 39.3 09/05/2023 0858   HCT 41.7 07/02/2019 0000   PLT 195 09/05/2023 0858   PLT 214 07/02/2019 0000   MCV 90.8 09/05/2023 0858   MCV 92 07/02/2019 0000   MCH 29.6 09/05/2023 0858   MCHC 32.6 09/05/2023 0858   RDW 12.4 09/05/2023 0858   RDW 12.3 07/02/2019 0000   LYMPHSABS 2,503 07/11/2020 1102   LYMPHSABS 3.0 07/02/2019 0000   EOSABS 143 07/11/2020 1102   EOSABS 0.1 07/02/2019 0000   BASOSABS 22 07/11/2020 1102   BASOSABS 0.0 07/02/2019 0000    Hgb A1C Lab Results  Component Value Date   HGBA1C 5.7 (H) 09/05/2023           Assessment & Plan:    RTC in 6 months for your annual exam Angeline Laura, NP

## 2024-03-10 NOTE — Assessment & Plan Note (Signed)
 Continue Tylenol OTC, cyclobenzaprine 10 mg and meloxicam  7.5 mg daily.

## 2024-03-10 NOTE — Assessment & Plan Note (Signed)
 C-Met and lipid profile today Encouraged her to consume a low-fat diet Continue atorvastatin  20 mg 2 times weekly

## 2024-03-10 NOTE — Patient Instructions (Signed)

## 2024-03-10 NOTE — Assessment & Plan Note (Signed)
 A1c today Encourage low-carb diet and exercise for weight loss

## 2024-03-10 NOTE — Assessment & Plan Note (Signed)
 Encouraged diet and exercise for weight loss ?

## 2024-03-10 NOTE — Assessment & Plan Note (Signed)
 Encouraged regular stretching and core strengthening Continue Tylenol OTC, cyclobenzaprine 10 mg and meloxicam  7.5 mg daily.

## 2024-03-11 ENCOUNTER — Ambulatory Visit: Payer: Self-pay | Admitting: Internal Medicine

## 2024-03-11 LAB — CBC
HCT: 39.4 % (ref 35.0–45.0)
Hemoglobin: 12.8 g/dL (ref 11.7–15.5)
MCH: 29.6 pg (ref 27.0–33.0)
MCHC: 32.5 g/dL (ref 32.0–36.0)
MCV: 91.2 fL (ref 80.0–100.0)
MPV: 10.5 fL (ref 7.5–12.5)
Platelets: 187 Thousand/uL (ref 140–400)
RBC: 4.32 Million/uL (ref 3.80–5.10)
RDW: 12.4 % (ref 11.0–15.0)
WBC: 5.8 Thousand/uL (ref 3.8–10.8)

## 2024-03-11 LAB — COMPREHENSIVE METABOLIC PANEL WITH GFR
AG Ratio: 1.8 (calc) (ref 1.0–2.5)
ALT: 15 U/L (ref 6–29)
AST: 18 U/L (ref 10–35)
Albumin: 4.3 g/dL (ref 3.6–5.1)
Alkaline phosphatase (APISO): 78 U/L (ref 37–153)
BUN: 16 mg/dL (ref 7–25)
CO2: 28 mmol/L (ref 20–32)
Calcium: 9.1 mg/dL (ref 8.6–10.4)
Chloride: 105 mmol/L (ref 98–110)
Creat: 0.8 mg/dL (ref 0.50–1.03)
Globulin: 2.4 g/dL (ref 1.9–3.7)
Glucose, Bld: 96 mg/dL (ref 65–99)
Potassium: 4.1 mmol/L (ref 3.5–5.3)
Sodium: 140 mmol/L (ref 135–146)
Total Bilirubin: 0.4 mg/dL (ref 0.2–1.2)
Total Protein: 6.7 g/dL (ref 6.1–8.1)
eGFR: 87 mL/min/1.73m2 (ref >=60)

## 2024-03-11 LAB — LIPID PANEL
Cholesterol: 229 mg/dL — ABNORMAL HIGH (ref <200)
HDL: 58 mg/dL (ref >=50)
LDL Cholesterol (Calc): 151 mg/dL — ABNORMAL HIGH
Non-HDL Cholesterol (Calc): 171 mg/dL — ABNORMAL HIGH (ref <130)
Total CHOL/HDL Ratio: 3.9 (calc) (ref <5.0)
Triglycerides: 94 mg/dL (ref <150)

## 2024-03-11 LAB — HEMOGLOBIN A1C
Hgb A1c MFr Bld: 5.6 % (ref <5.7)
Mean Plasma Glucose: 114 mg/dL
eAG (mmol/L): 6.3 mmol/L

## 2024-03-11 MED ORDER — ATORVASTATIN CALCIUM 40 MG PO TABS: 40.0000 mg | ORAL_TABLET | ORAL | 1 refills | Status: AC

## 2024-03-11 NOTE — Telephone Encounter (Signed)
 Sent atorvastatin  40 mg to pharmacy

## 2024-04-30 ENCOUNTER — Telehealth: Admitting: Physician Assistant

## 2024-04-30 DIAGNOSIS — J208 Acute bronchitis due to other specified organisms: Secondary | ICD-10-CM

## 2024-04-30 DIAGNOSIS — B9689 Other specified bacterial agents as the cause of diseases classified elsewhere: Secondary | ICD-10-CM | POA: Diagnosis not present

## 2024-04-30 MED ORDER — ALBUTEROL SULFATE HFA 108 (90 BASE) MCG/ACT IN AERS: 1.0000 | INHALATION_SPRAY | Freq: Four times a day (QID) | RESPIRATORY_TRACT | 0 refills | Status: AC | PRN

## 2024-04-30 MED ORDER — PROMETHAZINE-DM 6.25-15 MG/5ML PO SYRP: 5.0000 mL | ORAL_SOLUTION | Freq: Four times a day (QID) | ORAL | 0 refills | Status: AC | PRN

## 2024-04-30 MED ORDER — AZITHROMYCIN 250 MG PO TABS: ORAL_TABLET | ORAL | 0 refills | Status: AC

## 2024-04-30 MED ORDER — PREDNISONE 20 MG PO TABS: 40.0000 mg | ORAL_TABLET | Freq: Every day | ORAL | 0 refills | Status: AC

## 2024-04-30 NOTE — Progress Notes (Signed)
 " Virtual Visit Consent   Jamie Stanley, you are scheduled for a virtual visit with a [facility] provider today. Just as with appointments in the office, your consent must be obtained to participate. Your consent will be active for this visit and any virtual visit you may have with one of our providers in the next 365 days. If you have a MyChart account, a copy of this consent can be sent to you electronically.  As this is a virtual visit, video technology does not allow for your provider to perform a traditional examination. This may limit your provider's ability to fully assess your condition. If your provider identifies any concerns that need to be evaluated in person or the need to arrange testing (such as labs, EKG, etc.), we will make arrangements to do so. Although advances in technology are sophisticated, we cannot ensure that it will always work on either your end or our end. If the connection with a video visit is poor, the visit may have to be switched to a telephone visit. With either a video or telephone visit, we are not always able to ensure that we have a secure connection.  By engaging in this virtual visit, you consent to the provision of healthcare and authorize for your insurance to be billed (if applicable) for the services provided during this visit. Depending on your insurance coverage, you may receive a charge related to this service.  I need to obtain your verbal consent now. Are you willing to proceed with your visit today? Samika Vetsch has provided verbal consent on 04/30/2024 for a virtual visit (video or telephone). Delon CHRISTELLA Dickinson, PA-C  Date: 04/30/2024 6:54 PM   Virtual Visit via Video Note   I, Delon CHRISTELLA Dickinson, connected with  Jamie Stanley  ([id number], December 01, 1970) on 04/30/2024 at  7:00 PM EST by a video-enabled telemedicine application and verified that I am speaking with the correct person using two identifiers.  Location: Patient: Virtual Visit Location Patient:  Home Provider: Virtual Visit Location Provider: Home Office   I discussed the limitations of evaluation and management by telemedicine and the availability of in person appointments. The patient expressed understanding and agreed to proceed.    History of Present Illness: Jamie Stanley is a 54 y.o. who identifies as a female who was assigned female at birth, and is being seen today for cough and congestion.  HPI: URI  This is a new problem. The current episode started in the past 7 days. The problem has been gradually worsening. There has been no fever. Associated symptoms include chest pain (heaviness and mucus building), congestion, coughing (croupy cough), headaches (today) and a sore throat (from coughing). Pertinent negatives include no diarrhea, ear pain, nausea, plugged ear sensation, rhinorrhea, sinus pain, sneezing, vomiting or wheezing. Treatments tried: tylenol. The treatment provided no relief.     Problems:  Patient Active Problem List   Diagnosis Date Noted   Prediabetes 03/07/2023   Overweight with body mass index (BMI) of 29 to 29.9 in adult 08/01/2021   Anxiety 07/11/2020   Rheumatoid arthritis of multiple sites with negative rheumatoid factor (HCC) 12/09/2019   Lumbar radiculopathy 07/16/2019   HLD (hyperlipidemia) 06/23/2015    Allergies: Allergies[1] Medications: Current Medications[2]  Observations/Objective: Patient is well-developed, well-nourished in no acute distress.  Resting comfortably at home.  Head is normocephalic, atraumatic.  No labored breathing.  Speech is clear and coherent with logical content.  Patient is alert and oriented at baseline.    Assessment and  Plan: 1. Acute bacterial bronchitis (Primary) - azithromycin (ZITHROMAX) 250 MG tablet; Take 2 tablets on day 1, then 1 tablet daily on days 2 through 5  Dispense: 6 tablet; Refill: 0 - predniSONE  (DELTASONE ) 20 MG tablet; Take 2 tablets (40 mg total) by mouth daily with breakfast.  Dispense: 10  tablet; Refill: 0 - albuterol  (VENTOLIN  HFA) 108 (90 Base) MCG/ACT inhaler; Inhale 1-2 puffs into the lungs every 6 (six) hours as needed.  Dispense: 8 g; Refill: 0 - promethazine-dextromethorphan (PROMETHAZINE-DM) 6.25-15 MG/5ML syrup; Take 5 mLs by mouth 4 (four) times daily as needed.  Dispense: 118 mL; Refill: 0  - Worsening over a week despite OTC medications - Will treat with Z-pack, Prednisone , Albuterol , and Promethazine DM - Push fluids.  - Rest.  - Steam and humidifier can help - Seek in person evaluation if worsening or symptoms fail to improve    Follow Up Instructions: I discussed the assessment and treatment plan with the patient. The patient was provided an opportunity to ask questions and all were answered. The patient agreed with the plan and demonstrated an understanding of the instructions.  A copy of instructions were sent to the patient via MyChart unless otherwise noted below.    The patient was advised to call back or seek an in-person evaluation if the symptoms worsen or if the condition fails to improve as anticipated.    Delon CHRISTELLA Dickinson, PA-C     [1]  Allergies Allergen Reactions   Cinnamon Anaphylaxis   Penicillins Rash    Rash    Demerol [Meperidine] Nausea Only    Nausea    [2]  Current Outpatient Medications:    albuterol  (VENTOLIN  HFA) 108 (90 Base) MCG/ACT inhaler, Inhale 1-2 puffs into the lungs every 6 (six) hours as needed., Disp: 8 g, Rfl: 0   azithromycin (ZITHROMAX) 250 MG tablet, Take 2 tablets on day 1, then 1 tablet daily on days 2 through 5, Disp: 6 tablet, Rfl: 0   predniSONE  (DELTASONE ) 20 MG tablet, Take 2 tablets (40 mg total) by mouth daily with breakfast., Disp: 10 tablet, Rfl: 0   promethazine-dextromethorphan (PROMETHAZINE-DM) 6.25-15 MG/5ML syrup, Take 5 mLs by mouth 4 (four) times daily as needed., Disp: 118 mL, Rfl: 0   acetaminophen (TYLENOL) 325 MG tablet, Take 650 mg by mouth every 6 (six) hours as needed., Disp: ,  Rfl:    atorvastatin  (LIPITOR) 40 MG tablet, Take 1 tablet (40 mg total) by mouth 2 (two) times a week., Disp: 30 tablet, Rfl: 1   cyclobenzaprine  (FLEXERIL ) 10 MG tablet, Take 1 tablet (10 mg total) by mouth as needed., Disp: 90 tablet, Rfl: 1   EPINEPHrine  0.3 mg/0.3 mL IJ SOAJ injection, Inject 0.3 mg into the muscle as needed for anaphylaxis., Disp: 1 each, Rfl: 1   escitalopram  (LEXAPRO ) 10 MG tablet, Take 1 tablet (10 mg total) by mouth daily., Disp: 90 tablet, Rfl: 1   meloxicam  (MOBIC ) 7.5 MG tablet, Take 1 tablet (7.5 mg total) by mouth as needed., Disp: 90 tablet, Rfl: 1   prednisoLONE acetate (PRED FORTE) 1 % ophthalmic suspension, SMARTSIG:In Eye(s), Disp: , Rfl:   "

## 2024-04-30 NOTE — Patient Instructions (Signed)
 " Clarita Gainer, thank you for joining Delon CHRISTELLA Dickinson, PA-C for today's virtual visit.  While this provider is not your primary care provider (PCP), if your PCP is located in our provider database this encounter information will be shared with them immediately following your visit.   A [facility] MyChart account gives you access to today's visit and all your visits, tests, and labs performed at [facility]  click here if you don't have a [facility] MyChart account or go to [email or web address]  Consent: (Patient) Jamie Stanley provided verbal consent for this virtual visit at the beginning of the encounter.  Current Medications:  Current Outpatient Medications:    albuterol  (VENTOLIN  HFA) 108 (90 Base) MCG/ACT inhaler, Inhale 1-2 puffs into the lungs every 6 (six) hours as needed., Disp: 8 g, Rfl: 0   azithromycin (ZITHROMAX) 250 MG tablet, Take 2 tablets on day 1, then 1 tablet daily on days 2 through 5, Disp: 6 tablet, Rfl: 0   predniSONE  (DELTASONE ) 20 MG tablet, Take 2 tablets (40 mg total) by mouth daily with breakfast., Disp: 10 tablet, Rfl: 0   promethazine-dextromethorphan (PROMETHAZINE-DM) 6.25-15 MG/5ML syrup, Take 5 mLs by mouth 4 (four) times daily as needed., Disp: 118 mL, Rfl: 0   acetaminophen (TYLENOL) 325 MG tablet, Take 650 mg by mouth every 6 (six) hours as needed., Disp: , Rfl:    atorvastatin  (LIPITOR) 40 MG tablet, Take 1 tablet (40 mg total) by mouth 2 (two) times a week., Disp: 30 tablet, Rfl: 1   cyclobenzaprine  (FLEXERIL ) 10 MG tablet, Take 1 tablet (10 mg total) by mouth as needed., Disp: 90 tablet, Rfl: 1   EPINEPHrine  0.3 mg/0.3 mL IJ SOAJ injection, Inject 0.3 mg into the muscle as needed for anaphylaxis., Disp: 1 each, Rfl: 1   escitalopram  (LEXAPRO ) 10 MG tablet, Take 1 tablet (10 mg total) by mouth daily., Disp: 90 tablet, Rfl: 1   meloxicam  (MOBIC ) 7.5 MG tablet, Take 1 tablet (7.5 mg total) by mouth as needed., Disp: 90 tablet, Rfl: 1    prednisoLONE acetate (PRED FORTE) 1 % ophthalmic suspension, SMARTSIG:In Eye(s), Disp: , Rfl:    Medications ordered in this encounter:  Meds ordered this encounter  Medications   azithromycin (ZITHROMAX) 250 MG tablet    Sig: Take 2 tablets on day 1, then 1 tablet daily on days 2 through 5    Dispense:  6 tablet    Refill:  0    Supervising Provider:   LAMPTEY, PHILIP O [[id number]]   predniSONE  (DELTASONE ) 20 MG tablet    Sig: Take 2 tablets (40 mg total) by mouth daily with breakfast.    Dispense:  10 tablet    Refill:  0    Supervising Provider:   BLAISE ALEENE KIDD [[id number]]   albuterol  (VENTOLIN  HFA) 108 (90 Base) MCG/ACT inhaler    Sig: Inhale 1-2 puffs into the lungs every 6 (six) hours as needed.    Dispense:  8 g    Refill:  0    Supervising Provider:   BLAISE ALEENE KIDD [[id number]]   promethazine-dextromethorphan (PROMETHAZINE-DM) 6.25-15 MG/5ML syrup    Sig: Take 5 mLs by mouth 4 (four) times daily as needed.    Dispense:  118 mL    Refill:  0    Supervising Provider:   BLAISE ALEENE KIDD [[id number]]     *If you need refills on other medications prior to your next appointment, please contact your pharmacy*  Follow-Up: Call back or seek an in-person  evaluation if the symptoms worsen or if the condition fails to improve as anticipated.  [facility] [phone]  Other Instructions Acute Bronchitis, Adult  Acute bronchitis is sudden inflammation of the main airways (bronchi) that come off the windpipe (trachea) in the lungs. The swelling causes the airways to get smaller and make more mucus than normal. This can make it hard to breathe and can cause coughing or noisy breathing (wheezing). Acute bronchitis may last several weeks. The cough may last longer. Allergies, asthma, and exposure to smoke may make the condition worse. What are the causes? This condition can be caused by germs and by substances that irritate the lungs, including: Cold and flu viruses. The  most common cause of this condition is the virus that causes the common cold. Bacteria. This is less common. Breathing in substances that irritate the lungs, including: Smoke from cigarettes and other forms of tobacco. Dust and pollen. Fumes from household cleaning products, gases, or burned fuel. Indoor or outdoor air pollution. What increases the risk? The following factors may make you more likely to develop this condition: A weak body's defense system, also called the immune system. A condition that affects your lungs and breathing, such as asthma. What are the signs or symptoms? Common symptoms of this condition include: Coughing. This may bring up clear, yellow, or green mucus from your lungs (sputum). Wheezing. Runny or stuffy nose. Having too much mucus in your lungs (chest congestion). Shortness of breath. Aches and pains, including sore throat or chest. How is this diagnosed? This condition is usually diagnosed based on: Your symptoms and medical history. A physical exam. You may also have other tests, including tests to rule out other conditions, such as pneumonia. These tests include: A test of lung function. Test of a mucus sample to look for the presence of bacteria. Tests to check the oxygen level in your blood. Blood tests. Chest X-ray. How is this treated? Most cases of acute bronchitis clear up over time without treatment. Your health care provider may recommend: Drinking more fluids to help thin your mucus so it is easier to cough up. Taking inhaled medicine (inhaler) to improve air flow in and out of your lungs. Using a vaporizer or a humidifier. These are machines that add water to the air to help you breathe better. Taking a medicine that thins mucus and clears congestion (expectorant). Taking a medicine that prevents or stops coughing (cough suppressant). It is not common to take an antibiotic medicine for this condition. Follow these instructions at  home:  Take over-the-counter and prescription medicines only as told by your health care provider. Use an inhaler, vaporizer, or humidifier as told by your health care provider. Take two teaspoons (10 mL) of honey at bedtime to lessen coughing at night. Drink enough fluid to keep your urine pale yellow. Do not use any products that contain nicotine or tobacco. These products include cigarettes, chewing tobacco, and vaping devices, such as e-cigarettes. If you need help quitting, ask your health care provider. Get plenty of rest. Return to your normal activities as told by your health care provider. Ask your health care provider what activities are safe for you. Keep all follow-up visits. This is important. How is this prevented? To lower your risk of getting this condition again: Wash your hands often with soap and water for at least 20 seconds. If soap and water are not available, use hand sanitizer. Avoid contact with people who have cold symptoms. Try  not to touch your mouth, nose, or eyes with your hands. Avoid breathing in smoke or chemical fumes. Breathing smoke or chemical fumes will make your condition worse. Get the flu shot every year. Contact a health care provider if: Your symptoms do not improve after 2 weeks. You have trouble coughing up the mucus. Your cough keeps you awake at night. You have a fever. Get help right away if you: Cough up blood. Feel pain in your chest. Have severe shortness of breath. Faint or keep feeling like you are going to faint. Have a severe headache. Have a fever or chills that get worse. These symptoms may represent a serious problem that is an emergency. Do not wait to see if the symptoms will go away. Get medical help right away. Call your local emergency services (911 in the [place] Do not drive yourself to the hospital. Summary Acute bronchitis is inflammation of the main airways (bronchi) that come off the windpipe (trachea) in the lungs.  The swelling causes the airways to get smaller and make more mucus than normal. Drinking more fluids can help thin your mucus so it is easier to cough up. Take over-the-counter and prescription medicines only as told by your health care provider. Do not use any products that contain nicotine or tobacco. These products include cigarettes, chewing tobacco, and vaping devices, such as e-cigarettes. If you need help quitting, ask your health care provider. Contact a health care provider if your symptoms do not improve after 2 weeks. This information is not intended to replace advice given to you by your health care provider. Make sure you discuss any questions you have with your health care provider. Document Revised: 07/26/2021 Document Reviewed: 08/16/2020 Elsevier Patient Education  2024 [employer]   If you have been instructed to have an in-person evaluation today at a local Urgent Care facility, please use the link below. It will take you to a list of all of our available [facility], including address, phone number and hours of operation. Please do not delay care.  [facility]  If you or a family member do not have a primary care provider, use the link below to schedule a visit and establish care. When you choose a [facility] primary care physician or advanced practice provider, you gain a long-term partner in health. Find a Primary Care Provider  Learn more about [facility]'s in-office and virtual care options: [facility] - Get Care Now "

## 2024-09-07 ENCOUNTER — Encounter: Admitting: Internal Medicine

## 2024-10-13 ENCOUNTER — Ambulatory Visit: Admitting: Dermatology
